# Patient Record
Sex: Female | Born: 1990 | Race: Black or African American | Hispanic: No | Marital: Single | Smoking: Current every day smoker
Health system: Southern US, Community
[De-identification: ages and names within clinical notes are randomized; demographics above are authoritative.]

## PROBLEM LIST (undated history)

## (undated) DIAGNOSIS — F419 Anxiety disorder, unspecified: Secondary | ICD-10-CM

## (undated) DIAGNOSIS — G43909 Migraine, unspecified, not intractable, without status migrainosus: Secondary | ICD-10-CM

---

## 2016-06-01 ENCOUNTER — Emergency Department (HOSPITAL_COMMUNITY): Payer: Medicaid Other

## 2016-06-01 ENCOUNTER — Encounter (HOSPITAL_COMMUNITY): Payer: Self-pay | Admitting: Emergency Medicine

## 2016-06-01 ENCOUNTER — Emergency Department (HOSPITAL_COMMUNITY)
Admission: EM | Admit: 2016-06-01 | Discharge: 2016-06-01 | Disposition: A | Discharge status: Home / Self Care | Payer: Medicaid Other | Attending: Emergency Medicine | Admitting: Emergency Medicine

## 2016-06-01 DIAGNOSIS — N760 Acute vaginitis: Secondary | ICD-10-CM | POA: Diagnosis not present

## 2016-06-01 DIAGNOSIS — Y999 Unspecified external cause status: Secondary | ICD-10-CM | POA: Insufficient documentation

## 2016-06-01 DIAGNOSIS — S6991XA Unspecified injury of right wrist, hand and finger(s), initial encounter: Secondary | ICD-10-CM | POA: Diagnosis present

## 2016-06-01 DIAGNOSIS — S60221A Contusion of right hand, initial encounter: Secondary | ICD-10-CM | POA: Insufficient documentation

## 2016-06-01 DIAGNOSIS — F17210 Nicotine dependence, cigarettes, uncomplicated: Secondary | ICD-10-CM | POA: Insufficient documentation

## 2016-06-01 DIAGNOSIS — Y9389 Activity, other specified: Secondary | ICD-10-CM | POA: Insufficient documentation

## 2016-06-01 DIAGNOSIS — Y9289 Other specified places as the place of occurrence of the external cause: Secondary | ICD-10-CM | POA: Insufficient documentation

## 2016-06-01 LAB — URINALYSIS, ROUTINE W REFLEX MICROSCOPIC
BILIRUBIN URINE: NEGATIVE
GLUCOSE, UA: NEGATIVE mg/dL
Hgb urine dipstick: NEGATIVE
KETONES UR: NEGATIVE mg/dL
NITRITE: NEGATIVE
PH: 6 (ref 5.0–8.0)
PROTEIN: NEGATIVE mg/dL
Specific Gravity, Urine: 1.025 (ref 1.005–1.030)

## 2016-06-01 LAB — URINE MICROSCOPIC-ADD ON

## 2016-06-01 LAB — WET PREP, GENITAL
Clue Cells Wet Prep HPF POC: NONE SEEN
SPERM: NONE SEEN
TRICH WET PREP: NONE SEEN
YEAST WET PREP: NONE SEEN

## 2016-06-01 LAB — POC URINE PREG, ED: Preg Test, Ur: NEGATIVE
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
## 2016-06-01 NOTE — ED Notes (Signed)
PT DISCHARGED. INSTRUCTIONS GIVEN. AAOX4. PT IN NO APPARENT DISTRESS OR PAIN. THE OPPORTUNITY TO ASK QUESTIONS WAS PROVIDED. 

## 2016-06-01 NOTE — ED Notes (Signed)
Bed: WA03 Expected date:  Expected time:  Means of arrival:  Comments: 
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
## 2016-06-02 LAB — GC/CHLAMYDIA PROBE AMP (~~LOC~~) NOT AT ARMC
Chlamydia: NEGATIVE
NEISSERIA GONORRHEA: NEGATIVE

## 2016-06-02 LAB — RPR: RPR: NONREACTIVE

## 2016-06-02 LAB — HIV ANTIBODY (ROUTINE TESTING W REFLEX): HIV Screen 4th Generation wRfx: NONREACTIVE

## 2016-10-17 ENCOUNTER — Emergency Department (HOSPITAL_COMMUNITY)
Admission: EM | Admit: 2016-10-17 | Discharge: 2016-10-17 | Disposition: A | Discharge status: Home / Self Care | Payer: Medicaid Other | Attending: Emergency Medicine | Admitting: Emergency Medicine

## 2016-10-17 ENCOUNTER — Encounter (HOSPITAL_COMMUNITY): Payer: Self-pay | Admitting: Emergency Medicine

## 2016-10-17 DIAGNOSIS — Z87891 Personal history of nicotine dependence: Secondary | ICD-10-CM | POA: Insufficient documentation

## 2016-10-17 DIAGNOSIS — G43809 Other migraine, not intractable, without status migrainosus: Secondary | ICD-10-CM | POA: Diagnosis not present

## 2016-10-17 DIAGNOSIS — G43909 Migraine, unspecified, not intractable, without status migrainosus: Secondary | ICD-10-CM | POA: Diagnosis present

## 2016-10-17 LAB — I-STAT BETA HCG BLOOD, ED (MC, WL, AP ONLY)

## 2016-10-17 MED ORDER — DEXAMETHASONE SODIUM PHOSPHATE 10 MG/ML IJ SOLN
10.0000 mg | Freq: Once | INTRAMUSCULAR | Status: AC
  Administered 2016-10-17: 10 mg via INTRAMUSCULAR
  Filled 2016-10-17: qty 1

## 2016-10-17 MED ORDER — DIPHENHYDRAMINE HCL 25 MG PO CAPS
50.0000 mg | ORAL_CAPSULE | ORAL | Status: AC
  Administered 2016-10-17: 50 mg via ORAL
  Filled 2016-10-17: qty 2

## 2016-10-17 MED ORDER — KETOROLAC TROMETHAMINE 60 MG/2ML IM SOLN
60.0000 mg | Freq: Once | INTRAMUSCULAR | Status: AC
  Administered 2016-10-17: 60 mg via INTRAMUSCULAR
  Filled 2016-10-17: qty 2

## 2016-10-17 NOTE — ED Provider Notes (Addendum)
[facility]-EMERGENCY DEPT Provider Note   CSN: [id number] Arrival date & time: 10/17/16  0121   By signing my name below, I, Clovis PuAvnee Patel, attest that this documentation has been prepared under the direction and in the presence of Huck Ashworth, MD  Electronically Signed: Clovis PuAvnee Patel, ED Scribe. 10/17/16. 3:46 AM.   History   Chief Complaint Chief Complaint  Patient presents with  . Migraine   The history is provided by the patient. No language interpreter was used.  Migraine  This is a new problem. The current episode started yesterday. The problem has been gradually worsening. Associated symptoms include headaches. Pertinent negatives include no chest pain, no abdominal pain and no shortness of breath. Nothing aggravates the symptoms. Nothing relieves the symptoms. Treatments tried: BC powder  The treatment provided mild relief.   HPI Comments:  Rebekah Bray is a 26 y.o. female who presents to the Emergency Department complaining of acute onset, gradually worsening, moderate posterior headache onset yesterday. She also reports photophobia. She has taken BC powder, last taken at 8 PM, without relief. Pt denies any other associated symptoms at this time. No other complaints noted.    History reviewed. No pertinent past medical history.  There are no active problems to display for this patient.   History reviewed. No pertinent surgical history.  OB History    No data available       Home Medications    Prior to Admission medications   Not on File    Family History No family history on file.  Social History Social History  Substance Use Topics  . Smoking status: Former Smoker    Types: Cigarettes  . Smokeless tobacco: Never Used  . Alcohol use Yes     Comment: socially     Allergies   Patient has no known allergies.   Review of Systems Review of Systems  Constitutional: Negative for diaphoresis and fever.  HENT: Negative for congestion.   Eyes: Positive for  photophobia. Negative for visual disturbance.  Respiratory: Negative for shortness of breath.   Cardiovascular: Negative for chest pain.  Gastrointestinal: Negative for abdominal pain and vomiting.  Musculoskeletal: Negative for neck pain and neck stiffness.  Skin: Negative for rash.  Neurological: Positive for headaches. Negative for dizziness, tremors, seizures, syncope, facial asymmetry, speech difficulty, weakness, light-headedness and numbness.  All other systems reviewed and are negative.  Physical Exam Updated Vital Signs BP 115/70 (BP Location: Left Arm)   Pulse 72   Temp 98 F (36.7 C) (Oral)   Resp 17   Ht 5\' 9"  (1.753 m)   Wt 197 lb (89.4 kg)   SpO2 94%   BMI 29.09 kg/m   Physical Exam  Constitutional: She is oriented to person, place, and time. She appears well-developed and well-nourished. No distress.  HENT:  Head: Normocephalic and atraumatic.  Mouth/Throat: Oropharynx is clear and moist. No oropharyngeal exudate.  Moist mucous membranes no proptosis no change in cognition  Eyes: Conjunctivae and EOM are normal. Pupils are equal, round, and reactive to light.  Neck: Normal range of motion. Neck supple. No JVD present. No thyromegaly present.  Trachea midline No bruit  Cardiovascular: Normal rate, regular rhythm and normal heart sounds.   Pulmonary/Chest: Effort normal and breath sounds normal. No stridor. No respiratory distress. She has no wheezes. She has no rales.  Abdominal: Soft. Bowel sounds are normal. She exhibits no distension and no mass. There is no tenderness. There is no rebound and no guarding.  Musculoskeletal: Normal  range of motion. She exhibits no edema.  Lymphadenopathy:    She has no cervical adenopathy.  Neurological: She is alert and oriented to person, place, and time. She has normal reflexes. She displays normal reflexes. No cranial nerve deficit. She exhibits normal muscle tone. Coordination normal.  CN 3-12 intact. 5/5 strength to upper  extremities.   Skin: Skin is warm and dry. Capillary refill takes less than 2 seconds.  Psychiatric: She has a normal mood and affect. Her behavior is normal.  Nursing note and vitals reviewed.    ED Treatments / Results   Vitals:   10/17/16 0129  BP: 115/70  Pulse: 72  Resp: 17  Temp: 98 F (36.7 C)    DIAGNOSTIC STUDIES: Oxygen Saturation is 94% on RA, normal by my interpretation.    Results for orders placed or performed during the hospital encounter of 10/17/16  I-Stat Beta hCG blood, ED ([facility], [facility], [facility] only)  Result Value Ref Range   I-stat hCG, quantitative <5.0 <5 mIU/mL   Comment 3           No results found.  COORDINATION OF CARE: 3:45 AM Discussed treatment plan with pt at bedside and pt agreed to plan.   Radiology No results found.  Procedures Procedures (including critical care time)  Medications Ordered in ED Medications  ketorolac (TORADOL) injection 60 mg (not administered)  dexamethasone (DECADRON) injection 10 mg (not administered)  diphenhydrAMINE (BENADRYL) capsule 50 mg (not administered)     Initial Impression / Assessment and Plan / ED Course  I have reviewed the triage vital signs and the nursing notes. Pertinent labs & imaging results that were available during my care of the patient were reviewed by me and considered in my medical decision making (see chart for details).     This is a 26 y.o. -year-old female presents with her typical migraine.  The patient is nontoxic-appearing on exam and vital signs are normal.  No signs of infection, or thrombosis.  No indication for CT or LP at this time.    The patient is nontoxic-appearing on exam and vital signs are normal. After history, exam, and medical workup I feel the patient has been appropriately medically screened and is safe for discharge home. Pertinent diagnoses were discussed with the patient. Patient was given return precautions.  Strict return precautions for fever, weakness,  changes in vision or speech or cognition, numbnessor any concerns. I personally performed the services described in this documentation, which was scribed in my presence. The recorded information has been reviewed and is accurate.   After history, exam, and medical workup I feel the patient has been appropriately medically screened and is safe for discharge home. Pertinent diagnoses were discussed with the patient. Patient was given return precautions.  I personally performed the services described in this documentation, which was scribed in my presence. The recorded information has been reviewed and is accurate.       Cy Blamer, MD 10/17/16 [facility], MD 10/17/16 1610    Cy Blamer, MD 10/17/16 [id number]

## 2016-10-17 NOTE — ED Triage Notes (Signed)
Pt comes with complaints of a headache that started earlier today.  About an hour or two ago it got more severe.  Sensitive to light.  Endorses emesis. Took goody powder earlier with very temporary relief.

## 2016-11-04 ENCOUNTER — Encounter (HOSPITAL_COMMUNITY): Payer: Self-pay | Admitting: Family Medicine

## 2016-11-04 ENCOUNTER — Ambulatory Visit (HOSPITAL_COMMUNITY)
Admission: EM | Admit: 2016-11-04 | Discharge: 2016-11-04 | Disposition: A | Discharge status: Home / Self Care | Payer: Medicaid Other | Attending: Family Medicine | Admitting: Family Medicine

## 2016-11-04 DIAGNOSIS — M5432 Sciatica, left side: Secondary | ICD-10-CM | POA: Diagnosis not present

## 2016-11-04 MED ORDER — PREDNISONE 10 MG (21) PO TBPK: ORAL_TABLET | ORAL | 0 refills | Status: DC

## 2016-11-04 MED ORDER — CYCLOBENZAPRINE HCL 10 MG PO TABS: 10.0000 mg | ORAL_TABLET | Freq: Two times a day (BID) | ORAL | 0 refills | Status: DC | PRN

## 2016-11-04 NOTE — ED Provider Notes (Signed)
CSN: [id number]     Arrival date & time 11/04/16  1824 History   None    Chief Complaint  Patient presents with  . Fall   (Consider location/radiation/quality/duration/timing/severity/associated sxs/prior Treatment) Patient c/o left back discomfort radiating down her left buttock and to her left knee.   The history is provided by the patient.  Fall  This is a new problem. The current episode started yesterday. The problem occurs constantly. The problem has not changed since onset.   History reviewed. No pertinent past medical history. History reviewed. No pertinent surgical history. History reviewed. No pertinent family history. Social History  Substance Use Topics  . Smoking status: Former Smoker    Types: Cigarettes  . Smokeless tobacco: Never Used  . Alcohol use Yes     Comment: socially   OB History    No data available     Review of Systems  Constitutional: Negative.   HENT: Negative.   Eyes: Negative.   Respiratory: Negative.   Cardiovascular: Negative.   Gastrointestinal: Negative.   Endocrine: Negative.   Genitourinary: Negative.   Musculoskeletal: Positive for arthralgias.  Allergic/Immunologic: Negative.   Neurological: Positive for numbness.  Hematological: Negative.   Psychiatric/Behavioral: Negative.     Allergies  Patient has no known allergies.  Home Medications   Prior to Admission medications   Medication Sig Start Date End Date Taking? Authorizing Provider  cyclobenzaprine (FLEXERIL) 10 MG tablet Take 1 tablet (10 mg total) by mouth 2 (two) times daily as needed for muscle spasms. 11/04/16   Deatra CanterWilliam J Dasja Brase, FNP  predniSONE (STERAPRED UNI-PAK 21 TAB) 10 MG (21) TBPK tablet Take 6-5-4-3-2-1 po qd 11/04/16   Deatra CanterWilliam J Hrishikesh Hoeg, FNP   Meds Ordered and Administered this Visit  Medications - No data to display  BP 115/75   Pulse 90   Temp 98.4 F (36.9 C)   Resp 18   LMP 10/29/2016   SpO2 100%  No data found.   Physical Exam   Constitutional: She appears well-developed and well-nourished.  HENT:  Head: Normocephalic and atraumatic.  Eyes: Conjunctivae and EOM are normal. Pupils are equal, round, and reactive to light.  Neck: Normal range of motion. Neck supple.  Cardiovascular: Normal rate, regular rhythm and normal heart sounds.   Pulmonary/Chest: Effort normal and breath sounds normal.  Musculoskeletal: Normal range of motion.  TTP left lumbar spine and left sciatic notch.  Neg SLR bilateral.  Nursing note and vitals reviewed.   Urgent Care Course     Procedures (including critical care time)  Labs Review Labs Reviewed - No data to display  Imaging Review No results found.   Visual Acuity Review  Right Eye Distance:   Left Eye Distance:   Bilateral Distance:    Right Eye Near:   Left Eye Near:    Bilateral Near:         MDM   1. Left sided sciatica    Prednisone taper 10mg  take 6-5-4-3-2-1 po qd  #21 Flexeril 10mg  one po bid prn #20  Note for work today  Explained to use pillow for cushion      Deatra CanterWilliam J Leonie Amacher, FNP 11/04/16 1907

## 2016-11-04 NOTE — ED Triage Notes (Addendum)
Pt here for fall at work on Saturday, sts she is hurting in her left lower back and radiating down left leg with numbness and tingling in her toes.

## 2016-11-25 ENCOUNTER — Emergency Department (HOSPITAL_COMMUNITY)
Admission: EM | Admit: 2016-11-25 | Discharge: 2016-11-25 | Disposition: A | Discharge status: Home / Self Care | Payer: Medicaid Other | Attending: Emergency Medicine | Admitting: Emergency Medicine

## 2016-11-25 ENCOUNTER — Encounter (HOSPITAL_COMMUNITY): Payer: Self-pay | Admitting: Emergency Medicine

## 2016-11-25 DIAGNOSIS — G43909 Migraine, unspecified, not intractable, without status migrainosus: Secondary | ICD-10-CM

## 2016-11-25 DIAGNOSIS — F17210 Nicotine dependence, cigarettes, uncomplicated: Secondary | ICD-10-CM | POA: Insufficient documentation

## 2016-11-25 DIAGNOSIS — Z79899 Other long term (current) drug therapy: Secondary | ICD-10-CM | POA: Insufficient documentation

## 2016-11-25 DIAGNOSIS — Z7982 Long term (current) use of aspirin: Secondary | ICD-10-CM | POA: Insufficient documentation

## 2016-11-25 HISTORY — DX: Migraine, unspecified, not intractable, without status migrainosus: G43.909

## 2016-11-25 MED ORDER — IBUPROFEN 800 MG PO TABS: 800.0000 mg | ORAL_TABLET | Freq: Three times a day (TID) | ORAL | 0 refills | Status: DC

## 2016-11-25 MED ORDER — METOCLOPRAMIDE HCL 10 MG PO TABS: 10.0000 mg | ORAL_TABLET | Freq: Four times a day (QID) | ORAL | 0 refills | Status: DC | PRN

## 2016-11-25 MED ORDER — DIPHENHYDRAMINE HCL 50 MG/ML IJ SOLN
25.0000 mg | Freq: Once | INTRAMUSCULAR | Status: AC
  Administered 2016-11-25: 25 mg via INTRAMUSCULAR
  Filled 2016-11-25: qty 1

## 2016-11-25 MED ORDER — METOCLOPRAMIDE HCL 5 MG/ML IJ SOLN
10.0000 mg | Freq: Once | INTRAMUSCULAR | Status: AC
  Administered 2016-11-25: 10 mg via INTRAMUSCULAR
  Filled 2016-11-25: qty 2

## 2016-11-25 MED ORDER — DIPHENHYDRAMINE HCL 25 MG PO CAPS: 25.0000 mg | ORAL_CAPSULE | Freq: Four times a day (QID) | ORAL | 0 refills | Status: DC | PRN

## 2016-11-25 MED ORDER — KETOROLAC TROMETHAMINE 60 MG/2ML IM SOLN
60.0000 mg | Freq: Once | INTRAMUSCULAR | Status: AC
  Administered 2016-11-25: 60 mg via INTRAMUSCULAR
  Filled 2016-11-25: qty 2

## 2016-11-25 NOTE — Discharge Instructions (Signed)
You may try Benadryl with Compazine and ibuprofen as prescribed every 6 hours if needed for migraine headache. See your family doctor for recheck within the next 2-3 days. See a neurologist if her headaches are changing in pattern or severity.

## 2016-11-25 NOTE — ED Triage Notes (Signed)
Pt is c/o migraine headache that started around midnight and woke her up out of her sleep  Pt states she has nausea and vomiting and is sensitive to light  Pt states it is mainly the left side of her head that hurts  Pt states she took a hydrocodone around 3am without relief

## 2016-11-25 NOTE — ED Notes (Signed)
When ask to provide a urine sample for POC, pt sts "Im on my periodTeacher, music informed pt she would relay that message to RN.  RN notified.

## 2016-11-25 NOTE — ED Provider Notes (Signed)
[facility]-EMERGENCY DEPT Provider Note   CSN: [id number] Arrival date & time: 11/25/16  4098     History   Chief Complaint Chief Complaint  Patient presents with  . Migraine    HPI Rebekah Bray is a 26 y.o. female.  HPI Patient poor she has a high headache that at midnight. She reports it woke her up. She reports she has sharp pain on the left side of her head. She reports nausea and vomiting in association with this. She reports photophobia. Patient poor she has had migraine headaches since teenage years. She reports this is similar to prior migraines. No other associated symptoms. Review of systems is negative. Patient tried hydrocodone at 3 AM without relief. Past Medical History:  Diagnosis Date  . Migraine headache     There are no active problems to display for this patient.   History reviewed. No pertinent surgical history.  OB History    No data available       Home Medications    Prior to Admission medications   Medication Sig Start Date End Date Taking? Authorizing Provider  aspirin-acetaminophen-caffeine (EXCEDRIN MIGRAINE) [phone] MG tablet Take 1-2 tablets by mouth every 6 (six) hours as needed for headache.   Yes Historical Provider, MD  Aspirin-Salicylamide-Caffeine (BC HEADACHE POWDER PO) Take 1 each by mouth daily as needed (migraine).   Yes Historical Provider, MD  cyclobenzaprine (FLEXERIL) 10 MG tablet Take 1 tablet (10 mg total) by mouth 2 (two) times daily as needed for muscle spasms. Patient not taking: Reported on 11/25/2016 11/04/16   Deatra Canter, FNP  diphenhydrAMINE (BENADRYL) 25 mg capsule Take 1 capsule (25 mg total) by mouth every 6 (six) hours as needed. 11/25/16   Arby Barrette, MD  ibuprofen (ADVIL,MOTRIN) 800 MG tablet Take 1 tablet (800 mg total) by mouth 3 (three) times daily. 11/25/16   Arby Barrette, MD  metoCLOPramide (REGLAN) 10 MG tablet Take 1 tablet (10 mg total) by mouth every 6 (six) hours as needed for nausea  (nausea/headache). 11/25/16   Arby Barrette, MD  predniSONE (STERAPRED UNI-PAK 21 TAB) 10 MG (21) TBPK tablet Take 6-5-4-3-2-1 po qd Patient not taking: Reported on 11/25/2016 11/04/16   Deatra Canter, FNP    Family History History reviewed. No pertinent family history.  Social History Social History  Substance Use Topics  . Smoking status: Current Every Day Smoker    Types: Cigarettes  . Smokeless tobacco: Never Used  . Alcohol use Yes     Comment: socially     Allergies   Patient has no known allergies.   Review of Systems Review of Systems 10 Systems reviewed and are negative for acute change except as noted in the HPI.   Physical Exam Updated Vital Signs BP 114/74 (BP Location: Left Arm)   Pulse 69   Temp 97.5 F (36.4 C) (Oral)   Resp 18   Ht  (1.753 m)   Wt 203 lb (92.1 kg)   LMP 11/23/2016 (Exact Date)   SpO2 98%   BMI 29.98 kg/m   Physical Exam  Constitutional: She is oriented to person, place, and time. She appears well-developed and well-nourished. No distress.  Patient lying in the room on her side. She is alert and appropriate. No respiratory distress.  HENT:  Head: Normocephalic and atraumatic.  Nose: Nose normal.  Mouth/Throat: Oropharynx is clear and moist.  Eyes: Conjunctivae and EOM are normal. Pupils are equal, round, and reactive to light.  Neck: Neck supple.  Cardiovascular:  Normal rate and regular rhythm.   No murmur heard. Pulmonary/Chest: Effort normal and breath sounds normal. No respiratory distress.  Abdominal: Soft. There is no tenderness.  Musculoskeletal: She exhibits no tenderness.  Neurological: She is alert and oriented to person, place, and time. No cranial nerve deficit. She exhibits normal muscle tone. Coordination normal.  Skin: Skin is warm and dry.  Psychiatric: She has a normal mood and affect.  Nursing note and vitals reviewed.    ED Treatments / Results  Labs (all labs ordered are listed, but only abnormal  results are displayed) Labs Reviewed  POC URINE PREG, ED    EKG  EKG Interpretation None       Radiology No results found.  Procedures Procedures (including critical care time)  Medications Ordered in ED Medications  ketorolac (TORADOL) injection 60 mg (60 mg Intramuscular Given 11/25/16 0758)  diphenhydrAMINE (BENADRYL) injection 25 mg (25 mg Intramuscular Given 11/25/16 0803)  metoCLOPramide (REGLAN) injection 10 mg (10 mg Intramuscular Given 11/25/16 0806)     Initial Impression / Assessment and Plan / ED Course  I have reviewed the triage vital signs and the nursing notes.  Pertinent labs & imaging results that were available during my care of the patient were reviewed by me and considered in my medical decision making (see chart for details).      Final Clinical Impressions(s) / ED Diagnoses   Final diagnoses:  Migraine without status migrainosus, not intractable, unspecified migraine type   Patient presents with typical migraine headache. No associated symptoms or infectious symptoms. Patient stable for discharge with headache instructions and Benadryl, ibuprofen and Reglan to take for headache as needed. Patient is advised to follow-up with her primary care provider. New Prescriptions New Prescriptions   DIPHENHYDRAMINE (BENADRYL) 25 MG CAPSULE    Take 1 capsule (25 mg total) by mouth every 6 (six) hours as needed.   IBUPROFEN (ADVIL,MOTRIN) 800 MG TABLET    Take 1 tablet (800 mg total) by mouth 3 (three) times daily.   METOCLOPRAMIDE (REGLAN) 10 MG TABLET    Take 1 tablet (10 mg total) by mouth every 6 (six) hours as needed for nausea (nausea/headache).     Arby Barrette, MD 11/25/16 [phone]

## 2017-09-16 ENCOUNTER — Encounter (HOSPITAL_COMMUNITY): Payer: Self-pay | Admitting: Emergency Medicine

## 2017-09-16 ENCOUNTER — Emergency Department (HOSPITAL_COMMUNITY)
Admission: EM | Admit: 2017-09-16 | Discharge: 2017-09-16 | Disposition: A | Discharge status: Home / Self Care | Payer: Self-pay | Attending: Emergency Medicine | Admitting: Emergency Medicine

## 2017-09-16 ENCOUNTER — Emergency Department (HOSPITAL_COMMUNITY): Payer: Self-pay

## 2017-09-16 DIAGNOSIS — J029 Acute pharyngitis, unspecified: Secondary | ICD-10-CM | POA: Insufficient documentation

## 2017-09-16 DIAGNOSIS — B9789 Other viral agents as the cause of diseases classified elsewhere: Secondary | ICD-10-CM | POA: Insufficient documentation

## 2017-09-16 DIAGNOSIS — J069 Acute upper respiratory infection, unspecified: Secondary | ICD-10-CM | POA: Insufficient documentation

## 2017-09-16 MED ORDER — ACETAMINOPHEN-CODEINE 120-12 MG/5ML PO SOLN: 10.0000 mL | ORAL | 0 refills | Status: AC | PRN

## 2017-09-16 MED ORDER — GUAIFENESIN ER 1200 MG PO TB12: 1.0000 | ORAL_TABLET | Freq: Two times a day (BID) | ORAL | 0 refills | Status: AC

## 2017-09-16 MED ORDER — IBUPROFEN 800 MG PO TABS: 800.0000 mg | ORAL_TABLET | Freq: Three times a day (TID) | ORAL | 0 refills | Status: AC | PRN

## 2017-09-16 NOTE — Discharge Instructions (Signed)
Return here as needed. Follow up with your doctor.INcrease your fluid intake. Rest as much as possible.

## 2017-09-16 NOTE — ED Notes (Signed)
Bed: WTR6 Expected date:  Expected time:  Means of arrival:  Comments: 

## 2017-09-16 NOTE — ED Triage Notes (Signed)
Per pt, states she has been coughing up "boogers girl" for the past week-states throat irritated, congestion-no relief with over the counter meds

## 2017-09-17 NOTE — ED Provider Notes (Signed)
[facility]-EMERGENCY DEPT Provider Note   CSN: [id number] Arrival date & time: 09/16/17  [id number]     History   Chief Complaint Chief Complaint  Patient presents with  . Cough  . Sore Throat    HPI Rebekah Bray is a 27 y.o. female.  HPI Patient presents to the emergency department with cough, nasal congestion, and sore throat.  The patient states this is been ongoing for the last week.  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  Patient states that nothing seems to make the condition better or worse.  The patient denies chest pain, shortness of breath, headache,blurred vision, neck pain, fever, weakness, numbness, dizziness, anorexia, edema, abdominal pain, nausea, vomiting, diarrhea, rash, back pain, dysuria, near syncope, or syncope. Past Medical History:  Diagnosis Date  . Migraine headache     There are no active problems to display for this patient.   History reviewed. No pertinent surgical history.  OB History    No data available       Home Medications    Prior to Admission medications   Medication Sig Start Date End Date Taking? Authorizing Provider  aspirin-acetaminophen-caffeine (EXCEDRIN MIGRAINE) [phone] MG tablet Take 2-3 tablets by mouth every 6 (six) hours as needed for headache.    Yes [provider]  Aspirin-Salicylamide-Caffeine (BC HEADACHE POWDER PO) Take 1 each by mouth daily as needed (migraine).   Yes [provider]  DM-Phenylephrine-Acetaminophen (THERAFLU EXPRESSMAX) 10-5-325 MG/15ML LIQD Take 30 mLs by mouth every 8 (eight) hours as needed (cold).   Yes [provider]  acetaminophen-codeine 120-12 MG/5ML solution Take 10 mLs by mouth every 4 (four) hours as needed for moderate pain (and cough). 09/16/17   Jennyfer Nickolson, Cristal Deerhristopher, PA-C  Guaifenesin 1200 MG TB12 Take 1 tablet (1,200 mg total) by mouth 2  (two) times daily. 09/16/17   Scotty Weigelt, Cristal Deerhristopher, PA-C  ibuprofen (ADVIL,MOTRIN) 800 MG tablet Take 1 tablet (800 mg total) by mouth every 8 (eight) hours as needed. 09/16/17   Charon Akamine, Cristal Deerhristopher, PA-C  metoCLOPramide (REGLAN) 10 MG tablet Take 1 tablet (10 mg total) by mouth every 6 (six) hours as needed for nausea (nausea/headache). Patient not taking: Reported on 09/16/2017 11/25/16   Arby BarrettePfeiffer, Marcy, MD    Family History No family history on file.  Social History Social History   Tobacco Use  . Smoking status: Current Every Day Smoker    Types: Cigarettes  . Smokeless tobacco: Never Used  Substance Use Topics  . Alcohol use: Yes    Comment: socially  . Drug use: No     Allergies   Patient has no known allergies.   Review of Systems Review of Systems All other systems negative except as documented in the HPI. All pertinent positives and negatives as reviewed in the HPI. Physical Exam Updated Vital Signs BP (!) 124/54 (BP Location: Left Arm)   Pulse 73   Temp 98.9 F (37.2 C) (Oral)   Resp 18   LMP 08/18/2017   SpO2 100%   Physical Exam  Constitutional: She is oriented to person, place, and time. She appears well-developed and well-nourished. No distress.  HENT:  Head: Normocephalic and atraumatic.  Mouth/Throat: Oropharynx is clear and moist.  Eyes: Pupils are equal, round, and reactive to light.  Neck: Normal range of motion. Neck supple.  Cardiovascular: Normal rate, regular rhythm and normal heart sounds. Exam reveals no gallop  and no friction rub.  No murmur heard. Pulmonary/Chest: Effort normal and breath sounds normal. No respiratory distress. She has no wheezes.  Abdominal: Soft. Bowel sounds are normal. She exhibits no distension. There is no tenderness.  Neurological: She is alert and oriented to person, place, and time. She exhibits normal muscle tone. Coordination normal.  Skin: Skin is warm and dry. Capillary refill takes less than 2 seconds. No rash  noted. No erythema.  Psychiatric: She has a normal mood and affect. Her behavior is normal.  Nursing note and vitals reviewed.    ED Treatments / Results  Labs (all labs ordered are listed, but only abnormal results are displayed) Labs Reviewed - No data to display  EKG  EKG Interpretation None       Radiology Dg Chest 2 View  Result Date: 09/16/2017 CLINICAL DATA:  Productive cough, shortness of breath, and nausea and vomiting for 1 week. EXAM: CHEST  2 VIEW COMPARISON:  None. FINDINGS: The heart size and mediastinal contours are within normal limits. Both lungs are clear. The visualized skeletal structures are unremarkable. IMPRESSION: Negative.  No active cardiopulmonary disease. Electronically Signed   By: Myles Rosenthal M.D.   On: 09/16/2017 12:43    Procedures Procedures (including critical care time)  Medications Ordered in ED Medications - No data to display   Initial Impression / Assessment and Plan / ED Course  I have reviewed the triage vital signs and the nursing notes.  Pertinent labs & imaging results that were available during my care of the patient were reviewed by me and considered in my medical decision making (see chart for details).     She will be treated for viral upper respiratory infection told return here as needed patient is advised to increase her fluid intake and rest as much as possible.  Patient agrees the plan and all questions were answered. Final Clinical Impressions(s) / ED Diagnoses   Final diagnoses:  Viral URI with cough    ED Discharge Orders        Ordered    ibuprofen (ADVIL,MOTRIN) 800 MG tablet  Every 8 hours PRN     09/16/17 1348    Guaifenesin 1200 MG TB12  2 times daily     09/16/17 1348    acetaminophen-codeine 120-12 MG/5ML solution  Every 4 hours PRN     09/16/17 1348       Charlestine Night, PA-C 09/17/17 1634    Gerhard Munch, MD 09/18/17 2203

## 2017-12-27 ENCOUNTER — Other Ambulatory Visit: Payer: Self-pay

## 2017-12-27 ENCOUNTER — Emergency Department (HOSPITAL_COMMUNITY)
Admission: EM | Admit: 2017-12-27 | Discharge: 2017-12-27 | Disposition: A | Discharge status: Home / Self Care | Payer: Medicaid Other | Attending: Emergency Medicine | Admitting: Emergency Medicine

## 2017-12-27 DIAGNOSIS — G43009 Migraine without aura, not intractable, without status migrainosus: Secondary | ICD-10-CM

## 2017-12-27 DIAGNOSIS — F17210 Nicotine dependence, cigarettes, uncomplicated: Secondary | ICD-10-CM | POA: Insufficient documentation

## 2017-12-27 MED ORDER — DIPHENHYDRAMINE HCL 25 MG PO CAPS
25.0000 mg | ORAL_CAPSULE | Freq: Once | ORAL | Status: AC
  Administered 2017-12-27: 25 mg via ORAL
  Filled 2017-12-27: qty 1

## 2017-12-27 MED ORDER — DEXAMETHASONE 4 MG PO TABS
10.0000 mg | ORAL_TABLET | Freq: Once | ORAL | Status: AC
  Administered 2017-12-27: 10 mg via ORAL
  Filled 2017-12-27: qty 2

## 2017-12-27 MED ORDER — METOCLOPRAMIDE HCL 10 MG PO TABS
10.0000 mg | ORAL_TABLET | Freq: Once | ORAL | Status: AC
  Administered 2017-12-27: 10 mg via ORAL
  Filled 2017-12-27: qty 1

## 2017-12-27 NOTE — ED Notes (Signed)
Bed: WA21 Expected date:  Expected time:  Means of arrival:  Comments: 

## 2017-12-27 NOTE — ED Provider Notes (Signed)
[facility]-EMERGENCY DEPT Provider Note  CSN: [id number] Arrival date & time: 12/27/17 1636  Chief Complaint(s) Migraine  HPI Rebekah Bray is a 27 y.o. female   The history is provided by the patient.  Migraine  This is a recurrent problem. The current episode started 6 to 12 hours ago. The problem occurs constantly. The problem has not changed since onset.Pertinent negatives include no chest pain, no abdominal pain and no shortness of breath. Exacerbated by: light and loud sounds. Nothing relieves the symptoms. Treatments tried: exedrine. The treatment provided mild relief.    Past Medical History Past Medical History:  Diagnosis Date  . Migraine headache    There are no active problems to display for this patient.  Home Medication(s) Prior to Admission medications   Medication Sig Start Date End Date Taking? Authorizing Provider  acetaminophen-codeine 120-12 MG/5ML solution Take 10 mLs by mouth every 4 (four) hours as needed for moderate pain (and cough). 09/16/17   Lawyer, Cristal Deer, PA-C  aspirin-acetaminophen-caffeine (EXCEDRIN MIGRAINE) [phone] MG tablet Take 2-3 tablets by mouth every 6 (six) hours as needed for headache.     [provider]  Aspirin-Salicylamide-Caffeine (BC HEADACHE POWDER PO) Take 1 each by mouth daily as needed (migraine).    [provider]  DM-Phenylephrine-Acetaminophen (THERAFLU EXPRESSMAX) 10-5-325 MG/15ML LIQD Take 30 mLs by mouth every 8 (eight) hours as needed (cold).    [provider]  Guaifenesin 1200 MG TB12 Take 1 tablet (1,200 mg total) by mouth 2 (two) times daily. 09/16/17   Lawyer, Cristal Deer, PA-C  ibuprofen (ADVIL,MOTRIN) 800 MG tablet Take 1 tablet (800 mg total) by mouth every 8 (eight) hours as needed. 09/16/17   Lawyer, Cristal Deer, PA-C  metoCLOPramide (REGLAN) 10 MG tablet Take 1 tablet (10 mg total) by mouth every 6 (six) hours as needed for nausea (nausea/headache). Patient not  taking: Reported on 09/16/2017 11/25/16   Arby Barrette, MD                                                                                                                                    Past Surgical History No past surgical history on file. Family History No family history on file.  Social History Social History   Tobacco Use  . Smoking status: Current Every Day Smoker    Types: Cigarettes  . Smokeless tobacco: Never Used  Substance Use Topics  . Alcohol use: Yes    Comment: socially  . Drug use: No   Allergies Patient has no known allergies.  Review of Systems Review of Systems  Respiratory: Negative for shortness of breath.   Cardiovascular: Negative for chest pain.  Gastrointestinal: Negative for abdominal pain.   All other systems are reviewed and are negative for acute change except as noted in the HPI  Physical Exam Vital Signs  I have reviewed the triage vital signs BP 102/76 (BP Location: Right Arm)   Pulse 64  Temp 97.8 F (36.6 C) (Oral)   Resp 18   Ht  (1.702 m)   Wt 99.8 kg (220 lb)   SpO2 100%   BMI 34.46 kg/m   Physical Exam  Constitutional: She is oriented to person, place, and time. She appears well-developed and well-nourished. No distress.  HENT:  Head: Normocephalic and atraumatic.  Right Ear: External ear normal.  Left Ear: External ear normal.  Nose: Nose normal.  Eyes: Conjunctivae and EOM are normal. No scleral icterus.  Neck: Normal range of motion and phonation normal.  Cardiovascular: Normal rate and regular rhythm.  Pulmonary/Chest: Effort normal. No stridor. No respiratory distress.  Abdominal: She exhibits no distension.  Musculoskeletal: Normal range of motion. She exhibits no edema.  Neurological: She is alert and oriented to person, place, and time.  Mental Status:  Alert and oriented to person, place, and time.  Attention and concentration normal.  Speech clear.  Recent memory is intact  Cranial Nerves:  II  Visual Fields: Intact to confrontation. Visual fields intact. III, IV, VI: Pupils equal and reactive to light and near. Full eye movement without nystagmus  V Facial Sensation: Normal. No weakness of masticatory muscles  VII: No facial weakness or asymmetry  VIII Auditory Acuity: Grossly normal  IX/X: The uvula is midline; the palate elevates symmetrically  XI: Normal sternocleidomastoid and trapezius strength  XII: The tongue is midline. No atrophy or fasciculations.   Motor System: Muscle Strength: 5/5 and symmetric in the upper and lower extremities. No pronation or drift.  Muscle Tone: Tone and muscle bulk are normal in the upper and lower extremities.   Reflexes: DTRs: 1+ and symmetrical in all four extremities. No Clonus Coordination:  No tremor.  Sensation: Intact to light touch, and pinprick. Gait: Routine gait normal.   Skin: She is not diaphoretic.  Psychiatric: She has a normal mood and affect. Her behavior is normal.  Vitals reviewed.   ED Results and Treatments Labs (all labs ordered are listed, but only abnormal results are displayed) Labs Reviewed - No data to display                                                                                                                       EKG  EKG Interpretation  Date/Time:    Ventricular Rate:    PR Interval:    QRS Duration:   QT Interval:    QTC Calculation:   R Axis:     Text Interpretation:        Radiology No results found. Pertinent labs & imaging results that were available during my care of the patient were reviewed by me and considered in my medical decision making (see chart for details).  Medications Ordered in ED Medications  metoCLOPramide (REGLAN) tablet 10 mg (has no administration in time range)  dexamethasone (DECADRON) tablet 10 mg (has no administration in time range)  diphenhydrAMINE (BENADRYL) capsule 25 mg (has no administration in time range)  Procedures Procedures  (including critical care time)  Medical Decision Making / ED Course I have reviewed the nursing notes for this encounter and the patient's prior records (if available in EHR or on provided paperwork).    Typical migraine headache for the pt. Non focal neuro exam. No recent head trauma. No fever. Doubt meningitis. Doubt intracranial bleed. Doubt IIH. No indication for imaging. Patient given option to treat with migraine cocktail orally with discharge or IV and reassessment. She opted oral medication, DC home, and rest in dark room.  The patient appears reasonably screened and/or stabilized for discharge and I doubt any other medical condition or other [facility] requiring further screening, evaluation, or treatment in the ED at this time prior to discharge.  The patient is safe for discharge with strict return precautions.   Final Clinical Impression(s) / ED Diagnoses Final diagnoses:  Migraine without aura and without status migrainosus, not intractable    Disposition: Discharge  Condition: Good  I have discussed the results, Dx and Tx plan with the patient who expressed understanding and agree(s) with the plan. Discharge instructions discussed at great length. The patient was given strict return precautions who verbalized understanding of the instructions. No further questions at time of discharge.    ED Discharge Orders    None       Follow Up: Primary care provider   If you do not have a primary care physician, contact [facility] at [phone] for referral  [facility] [address]     [address] [place] [address] [phone]    [facility] [address] [place], [address] [place] [address] [phone]       This chart was dictated using voice recognition software.  Despite best efforts  to proofread,  errors can occur which can change the documentation meaning.   Nira Conn, MD 12/27/17 1800

## 2017-12-27 NOTE — ED Triage Notes (Addendum)
Pt to ed via POV with complaints of a migraine that started this morning. Pt states she took Excedrin 3-4 hours ago with no relief. Pt does have history of migraines. Pt rates the pain as 8/10 posterior, on top of the head and behind her eyes. Pt denies an injury to her head, states that she has light sensitivity at this time, and admits to drinking ETOH last night.

## 2018-09-18 ENCOUNTER — Emergency Department (HOSPITAL_COMMUNITY)
Admission: EM | Admit: 2018-09-18 | Discharge: 2018-09-18 | Disposition: A | Discharge status: Home / Self Care | Payer: Medicaid Other | Attending: Emergency Medicine | Admitting: Emergency Medicine

## 2018-09-18 ENCOUNTER — Encounter (HOSPITAL_COMMUNITY): Payer: Self-pay | Admitting: *Deleted

## 2018-09-18 ENCOUNTER — Emergency Department (HOSPITAL_COMMUNITY): Payer: Medicaid Other

## 2018-09-18 DIAGNOSIS — Z720 Tobacco use: Secondary | ICD-10-CM

## 2018-09-18 DIAGNOSIS — R6889 Other general symptoms and signs: Secondary | ICD-10-CM

## 2018-09-18 DIAGNOSIS — R110 Nausea: Secondary | ICD-10-CM

## 2018-09-18 DIAGNOSIS — R05 Cough: Secondary | ICD-10-CM

## 2018-09-18 DIAGNOSIS — R059 Cough, unspecified: Secondary | ICD-10-CM

## 2018-09-18 DIAGNOSIS — J111 Influenza due to unidentified influenza virus with other respiratory manifestations: Secondary | ICD-10-CM | POA: Insufficient documentation

## 2018-09-18 DIAGNOSIS — F17210 Nicotine dependence, cigarettes, uncomplicated: Secondary | ICD-10-CM | POA: Insufficient documentation

## 2018-09-18 MED ORDER — KETOROLAC TROMETHAMINE 30 MG/ML IJ SOLN
30.0000 mg | Freq: Once | INTRAMUSCULAR | Status: AC
Start: 2018-09-18 — End: 2018-09-18
  Administered 2018-09-18: 30 mg via INTRAMUSCULAR
  Filled 2018-09-18: qty 1

## 2018-09-18 MED ORDER — ONDANSETRON 8 MG PO TBDP
8.0000 mg | ORAL_TABLET | Freq: Once | ORAL | Status: AC
  Administered 2018-09-18: 8 mg via ORAL
  Filled 2018-09-18: qty 1

## 2018-09-18 MED ORDER — ONDANSETRON 4 MG PO TBDP: 4.0000 mg | ORAL_TABLET | Freq: Three times a day (TID) | ORAL | 0 refills | Status: DC | PRN

## 2018-09-18 NOTE — Discharge Instructions (Signed)
Continue to stay well-hydrated. Gargle warm salt water and spit it out and use chloraseptic spray as needed for sore throat. Continue to alternate between Tylenol and Ibuprofen for pain or fever. Use Mucinex/Robitussin/etc for cough suppression/expectoration of mucus. Use over the counter flonase and the netipot to help with nasal congestion. May consider over-the-counter Benadryl or other antihistamine like Claritin/Zyrtec/etc to decrease secretions and for help with your symptoms. Use zofran as directed as needed for nausea. STOP SMOKING! Follow up with your primary care doctor in 5-7 days for recheck of ongoing symptoms. Return to emergency department for emergent changing or worsening of symptoms.

## 2018-09-18 NOTE — ED Triage Notes (Signed)
Pt complains of sore throat, productive cough for the past week. Pt states she developed headache today.

## 2018-09-18 NOTE — ED Provider Notes (Signed)
[facility]-EMERGENCY DEPT Provider Note   CSN: [id number] Arrival date & time: 09/18/18  1711     History   Chief Complaint Chief Complaint  Patient presents with  . Sore Throat  . Cough  . Headache    HPI Rebekah Bray is a 28 y.o. female with a PMHx of migraines, who presents to the ED with complaints of flulike symptoms that began 1 week ago.  Symptoms include moderate body aches, headaches, chills, cough with green sputum production, sore throat, nausea, and rhinorrhea.  She tried Mucinex and Tylenol about an hour prior to arrival but has not noticed any benefit.  No known aggravating factors.  She wants to see if she has the flu.  She did not receive her flu shot this year.  No known sick contacts.  She endorses being a cigarette smoker but states that she has now quit because of her symptoms.  She states her headache feels like prior headaches before.  She denies any vision changes, fevers, drooling, trismus, ear pain or drainage, chest pain, shortness of breath, abdominal pain, vomiting, diarrhea, constipation, dysuria, hematuria, focal arthralgias, numbness, tingling, focal weakness, or any other complaints at this time.  The history is provided by the patient and medical records. No language interpreter was used.  Sore Throat  Associated symptoms include headaches. Pertinent negatives include no chest pain, no abdominal pain and no shortness of breath.  Cough  Associated symptoms: chills, headaches, myalgias, rhinorrhea and sore throat   Associated symptoms: no chest pain, no ear pain, no fever and no shortness of breath   Headache  Associated symptoms: cough, myalgias, nausea and sore throat   Associated symptoms: no abdominal pain, no diarrhea, no ear pain, no fever, no numbness, no vomiting and no weakness     Past Medical History:  Diagnosis Date  . Migraine headache     There are no active problems to display for this patient.   History  reviewed. No pertinent surgical history.   OB History   No obstetric history on file.      Home Medications    Prior to Admission medications   Medication Sig Start Date End Date Taking? Authorizing Provider  acetaminophen-codeine 120-12 MG/5ML solution Take 10 mLs by mouth every 4 (four) hours as needed for moderate pain (and cough). 09/16/17   Lawyer, Cristal Deerhristopher, PA-C  aspirin-acetaminophen-caffeine (EXCEDRIN MIGRAINE) [phone] MG tablet Take 2-3 tablets by mouth every 6 (six) hours as needed for headache.     [provider]  Aspirin-Salicylamide-Caffeine (BC HEADACHE POWDER PO) Take 1 each by mouth daily as needed (migraine).    [provider]  DM-Phenylephrine-Acetaminophen (THERAFLU EXPRESSMAX) 10-5-325 MG/15ML LIQD Take 30 mLs by mouth every 8 (eight) hours as needed (cold).    [provider]  Guaifenesin 1200 MG TB12 Take 1 tablet (1,200 mg total) by mouth 2 (two) times daily. 09/16/17   Lawyer, Cristal Deerhristopher, PA-C  ibuprofen (ADVIL,MOTRIN) 800 MG tablet Take 1 tablet (800 mg total) by mouth every 8 (eight) hours as needed. 09/16/17   Lawyer, Cristal Deerhristopher, PA-C  metoCLOPramide (REGLAN) 10 MG tablet Take 1 tablet (10 mg total) by mouth every 6 (six) hours as needed for nausea (nausea/headache). Patient not taking: Reported on 09/16/2017 11/25/16   Arby BarrettePfeiffer, Marcy, MD    Family History No family history on file.  Social History Social History   Tobacco Use  . Smoking status: Current Every Day Smoker    Types: Cigarettes  . Smokeless tobacco: Never  Used  Substance Use Topics  . Alcohol use: Yes    Comment: socially  . Drug use: No     Allergies   Patient has no known allergies.   Review of Systems Review of Systems  Constitutional: Positive for chills. Negative for fever.  HENT: Positive for rhinorrhea and sore throat. Negative for drooling, ear discharge, ear pain and trouble swallowing.   Eyes: Negative for visual disturbance.    Respiratory: Positive for cough. Negative for shortness of breath.   Cardiovascular: Negative for chest pain.  Gastrointestinal: Positive for nausea. Negative for abdominal pain, constipation, diarrhea and vomiting.  Genitourinary: Negative for dysuria and hematuria.  Musculoskeletal: Positive for myalgias. Negative for arthralgias.  Skin: Negative for color change.  Allergic/Immunologic: Negative for immunocompromised state.  Neurological: Positive for headaches. Negative for weakness and numbness.  Psychiatric/Behavioral: Negative for confusion.   All other systems reviewed and are negative for acute change except as noted in the HPI.    Physical Exam Updated Vital Signs BP 124/77 (BP Location: Left Arm)   Pulse 78   Temp 97.9 F (36.6 C) (Oral)   Resp 18   LMP 09/11/2018   SpO2 100%   Physical Exam Vitals signs and nursing note reviewed.  Constitutional:      General: She is not in acute distress.    Appearance: Normal appearance. She is well-developed. She is not toxic-appearing.     Comments: Afebrile, nontoxic, NAD  HENT:     Head: Normocephalic and atraumatic.     Right Ear: Hearing, tympanic membrane, ear canal and external ear normal.     Left Ear: Hearing, tympanic membrane, ear canal and external ear normal.     Ears:     Comments: Ears clear bilaterally    Nose: Rhinorrhea present.     Comments: Mild rhinorrhea    Mouth/Throat:     Mouth: Mucous membranes are moist.     Pharynx: Oropharynx is clear. Uvula midline. No pharyngeal swelling, oropharyngeal exudate, posterior oropharyngeal erythema or uvula swelling.     Tonsils: No tonsillar exudate or tonsillar abscesses.     Comments: Oropharynx clear and moist, without uvular swelling or deviation, no trismus or drooling, no tonsillar swelling or erythema, no exudates.   Eyes:     General:        Right eye: No discharge.        Left eye: No discharge.     Conjunctiva/sclera: Conjunctivae normal.  Neck:      Musculoskeletal: Normal range of motion and neck supple.  Cardiovascular:     Rate and Rhythm: Normal rate and regular rhythm.     Pulses: Normal pulses.     Heart sounds: Normal heart sounds, S1 normal and S2 normal. No murmur. No friction rub. No gallop.   Pulmonary:     Effort: Pulmonary effort is normal. No respiratory distress.     Breath sounds: Normal breath sounds. No decreased breath sounds, wheezing, rhonchi or rales.     Comments: CTAB in all lung fields, no w/r/r, no hypoxia or increased WOB, speaking in full sentences, SpO2 100% on RA  Abdominal:     General: Bowel sounds are normal. There is no distension.     Palpations: Abdomen is soft. Abdomen is not rigid.     Tenderness: There is no abdominal tenderness. There is no right CVA tenderness, left CVA tenderness, guarding or rebound. Negative signs include Murphy's sign and McBurney's sign.     Comments: Soft, NTND, +BS  throughout, no r/g/r, neg murphy's, neg mcburney's, no CVA TTP   Musculoskeletal: Normal range of motion.  Skin:    General: Skin is warm and dry.     Findings: No rash.  Neurological:     Mental Status: She is alert and oriented to person, place, and time.     Sensory: Sensation is intact. No sensory deficit.     Motor: Motor function is intact.     Comments: No focal neuro deficits  Psychiatric:        Mood and Affect: Mood and affect normal.        Behavior: Behavior normal.      ED Treatments / Results  Labs (all labs ordered are listed, but only abnormal results are displayed) Labs Reviewed - No data to display  EKG None  Radiology Dg Chest 2 View  Result Date: 09/18/2018 CLINICAL DATA:  28 year old female with sore throat, productive cough, headache. Smoker. EXAM: CHEST - 2 VIEW COMPARISON:  09/16/2017. FINDINGS: Lung volumes and mediastinal contours are stable and within normal limits. Visualized tracheal air column is within normal limits. No pneumothorax or pleural effusion. No confluent  pulmonary opacity. Borderline to mild increased interstitial markings appear stable. No acute osseous abnormality identified. Negative visible bowel gas pattern. Incidental no pole piercings. IMPRESSION: Mild increased interstitial markings appear stable and are probably smoking related. No acute cardiopulmonary abnormality. Electronically Signed   By: Odessa Fleming M.D.   On: 09/18/2018 18:20    Procedures Procedures (including critical care time)  Medications Ordered in ED Medications  ondansetron (ZOFRAN-ODT) disintegrating tablet 8 mg (8 mg Oral Given 09/18/18 1833)  ketorolac (TORADOL) 30 MG/ML injection 30 mg (30 mg Intramuscular Given 09/18/18 1833)     Initial Impression / Assessment and Plan / ED Course  I have reviewed the triage vital signs and the nursing notes.  Pertinent labs & imaging results that were available during my care of the patient were reviewed by me and considered in my medical decision making (see chart for details).     28 y.o. female here with flulike symptoms that began 1 week ago.  On exam, clear lungs, no abdominal tenderness, no focal neuro deficits, overall well-appearing, vital signs stable.  Throat clear.  Ears clear.  Nose with mild rhinorrhea. Suspect flu or other viral URI.  Doubt need for testing for flu at this time since she is outside of the window of time with Tamiflu would be effective.  Doubt strep.  Will obtain chest x-ray to evaluate for pneumonia.  Will give Toradol, Zofran, and reassess shortly.  6:38 PM CXR with stable mild increased interstitial markings likely from smoking hx, no acute findings. Pt feeling better. Likely viral URI/flu, outside of tamiflu window. Will rx zofran, advised OTC remedies for symptomatic relief, f/up with PCP in 1wk for recheck, smoking cessation strongly encouraged. I explained the diagnosis and have given explicit precautions to return to the ER including for any other new or worsening symptoms. The patient understands  and accepts the medical plan as it's been dictated and I have answered their questions. Discharge instructions concerning home care and prescriptions have been given. The patient is STABLE and is discharged to home in good condition.    Final Clinical Impressions(s) / ED Diagnoses   Final diagnoses:  Flu-like symptoms  Cough  Tobacco user  Nausea    ED Discharge Orders         Ordered    ondansetron (ZOFRAN ODT) 4 MG disintegrating  tablet  Every 8 hours PRN     09/18/18 [address]1827           Delbert Darley, BryantownMercedes, [place] JerseyPA-C 09/18/18 1838    Tegeler, Canary Brimhristopher J, MD 09/18/18 [phone]

## 2019-07-04 ENCOUNTER — Encounter (HOSPITAL_COMMUNITY): Payer: Self-pay

## 2019-07-04 ENCOUNTER — Other Ambulatory Visit: Payer: Self-pay

## 2019-07-04 ENCOUNTER — Ambulatory Visit (HOSPITAL_COMMUNITY)
Admission: EM | Admit: 2019-07-04 | Discharge: 2019-07-04 | Disposition: A | Discharge status: Home / Self Care | Payer: Medicaid Other | Attending: Internal Medicine | Admitting: Internal Medicine

## 2019-07-04 DIAGNOSIS — Z20828 Contact with and (suspected) exposure to other viral communicable diseases: Secondary | ICD-10-CM | POA: Insufficient documentation

## 2019-07-04 DIAGNOSIS — J029 Acute pharyngitis, unspecified: Secondary | ICD-10-CM | POA: Insufficient documentation

## 2019-07-04 DIAGNOSIS — F17210 Nicotine dependence, cigarettes, uncomplicated: Secondary | ICD-10-CM

## 2019-07-04 DIAGNOSIS — Z20822 Contact with and (suspected) exposure to covid-19: Secondary | ICD-10-CM

## 2019-07-04 LAB — POCT RAPID STREP A: Streptococcus, Group A Screen (Direct): NEGATIVE

## 2019-07-04 MED ORDER — FLUTICASONE PROPIONATE 50 MCG/ACT NA SUSP: 1.0000 | Freq: Every day | NASAL | 0 refills | Status: AC

## 2019-07-04 NOTE — ED Triage Notes (Signed)
Pt presents with sore throat and headache since Friday.

## 2019-07-04 NOTE — Discharge Instructions (Signed)
Strep test was negative/ collection sent for culture Will call if result is positive Advised to gargle with Salty warm water Return to clinic if symptom get worse  COVID testing ordered.  It will take between 2-7 days for test results.  Someone will contact you regarding abnormal results.    In the meantime: You should remain isolated in your home for 10 days from symptom onset AND greater than 72 hours after symptoms resolution (absence of fever without the use of fever-reducing medication and improvement in respiratory symptoms), whichever is longer Get plenty of rest and push fluids Flonase prescribed for nasal congestion and runny nose Use medications daily for symptom relief Use OTC medications like ibuprofen or tylenol as needed fever or pain Call or go to the ED if you have any new or worsening symptoms such as fever, worsening cough, shortness of breath, chest tightness, chest pain, turning blue, changes in mental status, etc..Marland Kitchen

## 2019-07-04 NOTE — ED Provider Notes (Signed)
[facility]    CSN: [id number] Arrival date & time: 07/04/19  1405      History   Chief Complaint Chief Complaint  Patient presents with  . Sore Throat  . Headache    HPI Rebekah Bray is a 28 y.o. female.   The history is provided by the patient. No language interpreter was used.  Sore Throat This is a new problem. The current episode started more than 2 days ago. The problem occurs constantly. The problem has been gradually worsening. Associated symptoms include headaches. Pertinent negatives include no chest pain, no abdominal pain and no shortness of breath. Nothing aggravates the symptoms. Nothing relieves the symptoms. She has tried acetaminophen for the symptoms.  Headache Associated symptoms: cough, ear pain and sore throat   Associated symptoms: no abdominal pain, no diarrhea, no fatigue, no fever, no nausea and no vomiting     Past Medical History:  Diagnosis Date  . Migraine headache     There are no active problems to display for this patient.   History reviewed. No pertinent surgical history.  OB History   No obstetric history on file.      Home Medications    Prior to Admission medications   Medication Sig Start Date End Date Taking? Authorizing Provider  acetaminophen-codeine 120-12 MG/5ML solution Take 10 mLs by mouth every 4 (four) hours as needed for moderate pain (and cough). 09/16/17   Lawyer, Harrell Gave, PA-C  aspirin-acetaminophen-caffeine (EXCEDRIN MIGRAINE) [phone] MG tablet Take 2-3 tablets by mouth every 6 (six) hours as needed for headache.     [provider]  Aspirin-Salicylamide-Caffeine (BC HEADACHE POWDER PO) Take 1 each by mouth daily as needed (migraine).    [provider]  DM-Phenylephrine-Acetaminophen (THERAFLU EXPRESSMAX) 10-5-325 MG/15ML LIQD Take 30 mLs by mouth every 8 (eight) hours as needed (cold).    [provider]  fluticasone (FLONASE) 50 MCG/ACT nasal spray Place 1 spray  into both nostrils daily for 14 days. 07/04/19 07/18/19  Samariah Hokenson, Darrelyn Hillock, FNP  Guaifenesin 1200 MG TB12 Take 1 tablet (1,200 mg total) by mouth 2 (two) times daily. 09/16/17   Lawyer, Harrell Gave, PA-C  ibuprofen (ADVIL,MOTRIN) 800 MG tablet Take 1 tablet (800 mg total) by mouth every 8 (eight) hours as needed. 09/16/17   Lawyer, Harrell Gave, PA-C  metoCLOPramide (REGLAN) 10 MG tablet Take 1 tablet (10 mg total) by mouth every 6 (six) hours as needed for nausea (nausea/headache). Patient not taking: Reported on 09/16/2017 11/25/16   Charlesetta Shanks, MD  ondansetron (ZOFRAN ODT) 4 MG disintegrating tablet Take 1 tablet (4 mg total) by mouth every 8 (eight) hours as needed for nausea or vomiting. 09/18/18   Street, Rutledge, PA-C    Family History Family History  Problem Relation Age of Onset  . Healthy Neg Hx     Social History Social History   Tobacco Use  . Smoking status: Current Every Day Smoker    Types: Cigarettes  . Smokeless tobacco: Never Used  Substance Use Topics  . Alcohol use: Yes    Comment: socially  . Drug use: No     Allergies   Patient has no known allergies.   Review of Systems Review of Systems  Constitutional: Negative for activity change, appetite change, chills, fatigue and fever.  HENT: Positive for ear pain and sore throat.   Respiratory: Positive for cough. Negative for chest tightness and shortness of breath.   Cardiovascular: Negative for chest pain and leg swelling.  Gastrointestinal: Negative for  abdominal pain, diarrhea, nausea and vomiting.  Neurological: Positive for headaches.  ROS: all other are negatives  Physical Exam Triage Vital Signs ED Triage Vitals [07/04/19 1455]  Enc Vitals Group     BP 130/86     Pulse Rate 78     Resp 17     Temp 98.3 F (36.8 C)     Temp Source Oral     SpO2 100 %     Weight      Height      Head Circumference      Peak Flow      Pain Score      Pain Loc      Pain Edu?      Excl. in GC?    No  data found.  Updated Vital Signs BP 130/86 (BP Location: Right Arm)   Pulse 78   Temp 98.3 F (36.8 C) (Oral)   Resp 17   LMP 06/03/2019   SpO2 100%   Visual Acuity Right Eye Distance:   Left Eye Distance:   Bilateral Distance:    Right Eye Near:   Left Eye Near:    Bilateral Near:     Physical Exam Vitals signs and nursing note reviewed.  Constitutional:      General: She is not in acute distress.    Appearance: She is well-developed and normal weight. She is not ill-appearing or toxic-appearing.  HENT:     Head: Normocephalic.     Right Ear: Tympanic membrane and ear canal normal.     Left Ear: Tympanic membrane and ear canal normal.     Nose: No congestion.     Mouth/Throat:     Mouth: Mucous membranes are moist. No oral lesions.     Pharynx: No pharyngeal swelling, oropharyngeal exudate, posterior oropharyngeal erythema or uvula swelling.     Tonsils: No tonsillar exudate or tonsillar abscesses. 1+ on the right. 1+ on the left.  Cardiovascular:     Rate and Rhythm: Normal rate and regular rhythm.     Heart sounds: Normal heart sounds. No murmur. No friction rub. No gallop.   Pulmonary:     Effort: Pulmonary effort is normal. No respiratory distress.     Breath sounds: Rhonchi present. No wheezing.  Chest:     Chest wall: No tenderness.  Abdominal:     General: Bowel sounds are normal.     Palpations: Abdomen is soft.  Neurological:     Mental Status: She is alert and oriented to person, place, and time.      UC Treatments / Results  Labs (all labs ordered are listed, but only abnormal results are displayed) Labs Reviewed  NOVEL CORONAVIRUS, NAA (HOSP ORDER, SEND-OUT TO REF LAB; TAT 18-24 HRS)  CULTURE, GROUP A STREP [facility])  POCT RAPID STREP A    EKG   Radiology No results found.  Procedures Procedures (including critical care time)  Medications Ordered in UC Medications - No data to display  Initial Impression / Assessment and Plan / UC  Course  I have reviewed the triage vital signs and the nursing notes.  Pertinent labs & imaging results that were available during my care of the patient were reviewed by me and considered in my medical decision making (see chart for details).   Strep test was negative and collection was sent for culture. COVID test was ordered and patient was discharge in stable condition with no respiratory distress. Final Clinical Impressions(s) / UC Diagnoses  Final diagnoses:  Viral pharyngitis  Suspected COVID-19 virus infection     Discharge Instructions     Strep test was negative/ collection sent for culture Will call if result is positive Advised to gargle with Salty warm water Return to clinic if symptom get worse  COVID testing ordered.  It will take between 2-7 days for test results.  Someone will contact you regarding abnormal results.    In the meantime: You should remain isolated in your home for 10 days from symptom onset AND greater than 72 hours after symptoms resolution (absence of fever without the use of fever-reducing medication and improvement in respiratory symptoms), whichever is longer Get plenty of rest and push fluids Flonase prescribed for nasal congestion and runny nose Use medications daily for symptom relief Use OTC medications like ibuprofen or tylenol as needed fever or pain Call or go to the ED if you have any new or worsening symptoms such as fever, worsening cough, shortness of breath, chest tightness, chest pain, turning blue, changes in mental status, etc...    ED Prescriptions    Medication Sig Dispense Auth. Provider   fluticasone (FLONASE) 50 MCG/ACT nasal spray Place 1 spray into both nostrils daily for 14 days. 16 g Durward Parcel, FNP     PDMP not reviewed this encounter.   Durward Parcel, FNP 07/04/19 1531

## 2019-07-06 LAB — NOVEL CORONAVIRUS, NAA (HOSP ORDER, SEND-OUT TO REF LAB; TAT 18-24 HRS): SARS-CoV-2, NAA: NOT DETECTED

## 2019-07-07 LAB — CULTURE, GROUP A STREP (THRC)

## 2019-07-08 ENCOUNTER — Telehealth: Payer: Self-pay | Admitting: Emergency Medicine

## 2019-07-08 NOTE — Telephone Encounter (Signed)
Pt called requesting covid test results. Results reported as negative.  

## 2019-11-18 ENCOUNTER — Emergency Department (HOSPITAL_COMMUNITY)
Admission: EM | Admit: 2019-11-18 | Discharge: 2019-11-18 | Disposition: A | Discharge status: Home / Self Care | Payer: Medicaid Other | Attending: Emergency Medicine | Admitting: Emergency Medicine

## 2019-11-18 ENCOUNTER — Encounter (HOSPITAL_COMMUNITY): Payer: Self-pay

## 2019-11-18 ENCOUNTER — Other Ambulatory Visit: Payer: Self-pay

## 2019-11-18 DIAGNOSIS — F17210 Nicotine dependence, cigarettes, uncomplicated: Secondary | ICD-10-CM | POA: Insufficient documentation

## 2019-11-18 DIAGNOSIS — F419 Anxiety disorder, unspecified: Secondary | ICD-10-CM

## 2019-11-18 HISTORY — DX: Anxiety disorder, unspecified: F41.9

## 2019-11-18 MED ORDER — HYDROXYZINE HCL 25 MG PO TABS: 25.0000 mg | ORAL_TABLET | Freq: Four times a day (QID) | ORAL | 0 refills | Status: AC | PRN

## 2019-11-18 NOTE — ED Triage Notes (Signed)
Patient c/o anxiety and insomnia. Patient states she has been off of her psych meds x 6 months . Patient states she thought she could do without, but recently had a family event and felt like she could not handle everything that was going on. Patient denies SI/Hi.

## 2019-11-18 NOTE — ED Provider Notes (Signed)
[facility]-EMERGENCY DEPT Provider Note   CSN: [id number] Arrival date & time: 11/18/19  6160     History Chief Complaint  Patient presents with  . Anxiety    Rebekah Bray is a 29 y.o. female.  The history is provided by the patient and medical records. No language interpreter was used.  Anxiety     29 year old female with history of anxiety presenting here with complaints of feeling anxious.  Patient reports she was previously on Klonopin, Zoloft, and sertraline for her anxiety and depression.  She went off of her medication approximately 6 months ago when she felt she is well enough not needing medication and that her medication ran out.  For the past several weeks she endorsed worsening anxiety.  She treated her anxiety to increased stress from daily life but she denies any SI or HI.  She denies self-medicating with alcohol or drugs.  She is having more trouble getting adequate sleep.  She having bouts of anxiety and panic attack.  She does not have any significant pain.  She has not been seen by her psychiatrist for more than 6 months due to lack of resources.  She is here requesting help.  Past Medical History:  Diagnosis Date  . Anxiety   . Migraine headache     There are no problems to display for this patient.   History reviewed. No pertinent surgical history.   OB History   No obstetric history on file.     Family History  Problem Relation Age of Onset  . Hypertension Mother   . Diabetes Mother   . Healthy Neg Hx     Social History   Tobacco Use  . Smoking status: Current Every Day Smoker    Packs/day: 0.35    Types: Cigarettes  . Smokeless tobacco: Never Used  Substance Use Topics  . Alcohol use: Yes    Comment: socially  . Drug use: No    Home Medications Prior to Admission medications   Medication Sig Start Date End Date Taking? Authorizing Provider  acetaminophen-codeine 120-12 MG/5ML solution Take 10 mLs by mouth every  4 (four) hours as needed for moderate pain (and cough). 09/16/17   Lawyer, Cristal Deer, PA-C  aspirin-acetaminophen-caffeine (EXCEDRIN MIGRAINE) [phone] MG tablet Take 2-3 tablets by mouth every 6 (six) hours as needed for headache.     [provider]  Aspirin-Salicylamide-Caffeine (BC HEADACHE POWDER PO) Take 1 each by mouth daily as needed (migraine).    [provider]  DM-Phenylephrine-Acetaminophen (THERAFLU EXPRESSMAX) 10-5-325 MG/15ML LIQD Take 30 mLs by mouth every 8 (eight) hours as needed (cold).    [provider]  fluticasone (FLONASE) 50 MCG/ACT nasal spray Place 1 spray into both nostrils daily for 14 days. 07/04/19 07/18/19  Avegno, Zachery Dakins, FNP  Guaifenesin 1200 MG TB12 Take 1 tablet (1,200 mg total) by mouth 2 (two) times daily. 09/16/17   Lawyer, Cristal Deer, PA-C  ibuprofen (ADVIL,MOTRIN) 800 MG tablet Take 1 tablet (800 mg total) by mouth every 8 (eight) hours as needed. 09/16/17   Lawyer, Cristal Deer, PA-C  metoCLOPramide (REGLAN) 10 MG tablet Take 1 tablet (10 mg total) by mouth every 6 (six) hours as needed for nausea (nausea/headache). Patient not taking: Reported on 09/16/2017 11/25/16   Arby Barrette, MD  ondansetron (ZOFRAN ODT) 4 MG disintegrating tablet Take 1 tablet (4 mg total) by mouth every 8 (eight) hours as needed for nausea or vomiting. 09/18/18   Street, [place], [place]    Allergies  Patient has no known allergies.  Review of Systems   Review of Systems  All other systems reviewed and are negative.   Physical Exam Updated Vital Signs BP 118/76   Pulse 94   Temp 98.2 F (36.8 C) (Oral)   Resp 16   Ht 5' 7.5" (1.715 m)   Wt 88 kg   LMP 10/09/2019 (Approximate)   SpO2 100%   BMI 29.94 kg/m   Physical Exam Vitals and nursing note reviewed.  Constitutional:      General: She is not in acute distress.    Appearance: She is well-developed.  HENT:     Head: Atraumatic.  Eyes:     Conjunctiva/sclera: Conjunctivae  normal.  Cardiovascular:     Rate and Rhythm: Normal rate and regular rhythm.     Pulses: Normal pulses.     Heart sounds: Normal heart sounds.  Pulmonary:     Effort: Pulmonary effort is normal.     Breath sounds: Normal breath sounds. No wheezing, rhonchi or rales.  Abdominal:     Palpations: Abdomen is soft.     Tenderness: There is no abdominal tenderness.  Musculoskeletal:     Cervical back: Neck supple.  Skin:    Findings: No rash.  Neurological:     Mental Status: She is alert and oriented to person, place, and time.     GCS: GCS eye subscore is 4. GCS verbal subscore is 5. GCS motor subscore is 6.  Psychiatric:        Attention and Perception: Attention normal.        Mood and Affect: Mood normal.        Speech: Speech normal.        Behavior: Behavior is cooperative.        Thought Content: Thought content is not paranoid. Thought content does not include homicidal or suicidal ideation.     ED Results / Procedures / Treatments   Labs (all labs ordered are listed, but only abnormal results are displayed) Labs Reviewed - No data to display  EKG None  Radiology No results found.  Procedures Procedures (including critical care time)  Medications Ordered in ED Medications - No data to display  ED Course  I have reviewed the triage vital signs and the nursing notes.  Pertinent labs & imaging results that were available during my care of the patient were reviewed by me and considered in my medical decision making (see chart for details).    MDM Rules/Calculators/A&P                      BP 118/76   Pulse 94   Temp 98.2 F (36.8 C) (Oral)   Resp 16   Ht 5' 7.5" (1.715 m)   Wt 88 kg   LMP 10/09/2019 (Approximate)   SpO2 100%   BMI 29.94 kg/m   Final Clinical Impression(s) / ED Diagnoses Final diagnoses:  Anxiety    Rx / DC Orders ED Discharge Orders         Ordered    hydrOXYzine (ATARAX/VISTARIL) 25 MG tablet  Every 6 hours PRN     11/18/19  0947         9:45 AM Patient has bouts of anxiety and panic attack and requesting help.  No SI or HI.  She is well-appearing in no acute discomfort and is calm and cooperative.  Will prescribe hydroxyzine as needed for anxiety.  Will provide outpatient resources for psychiatric help.  Return precaution discussed.   Domenic Moras, PA-C 11/18/19 8421    Dorie Rank, MD 11/18/19 1447

## 2019-12-20 ENCOUNTER — Emergency Department (HOSPITAL_COMMUNITY)
Admission: EM | Admit: 2019-12-20 | Discharge: 2019-12-20 | Discharge status: Against Medical Advice | Payer: Medicaid Other | Attending: Emergency Medicine | Admitting: Emergency Medicine

## 2019-12-20 ENCOUNTER — Encounter (HOSPITAL_COMMUNITY): Payer: Self-pay | Admitting: *Deleted

## 2019-12-20 ENCOUNTER — Other Ambulatory Visit: Payer: Self-pay

## 2019-12-20 DIAGNOSIS — Z5321 Procedure and treatment not carried out due to patient leaving prior to being seen by health care provider: Secondary | ICD-10-CM | POA: Insufficient documentation

## 2019-12-20 DIAGNOSIS — R519 Headache, unspecified: Secondary | ICD-10-CM | POA: Diagnosis not present

## 2019-12-20 NOTE — ED Notes (Signed)
Registration said patient removed armband and left.

## 2019-12-20 NOTE — ED Triage Notes (Signed)
Pt states she has had a migraine since yesterday. Frontal head pain associated with n/v. Took tylenol for pain without relief.

## 2019-12-20 NOTE — ED Notes (Addendum)
Patient came out of her room and stated "I need some water and ice or something." Pt was made aware I was waiting for a PA or doctor to sign up for her case before I could give her any type of fluids in case they wanted to draw labs or order any type of scans. Pt then proceeded to say "Arent you a nurse, you should be able to get me some water. If you dont want to do it, then Ill find someone else who will... matter of fact, I need another fucking nurse because I dont like your attitude." Pt proceeded to go back in her room with her guest.

## 2020-08-11 IMAGING — CR DG CHEST 2V
2 series · 2 of 2 positions shown · non-contrast
Comparison: 09/16/2017.

CLINICAL DATA: 28-year-old female with sore throat, productive
cough, headache. Smoker.

EXAM:
CHEST - 2 VIEW

[w chest pa]
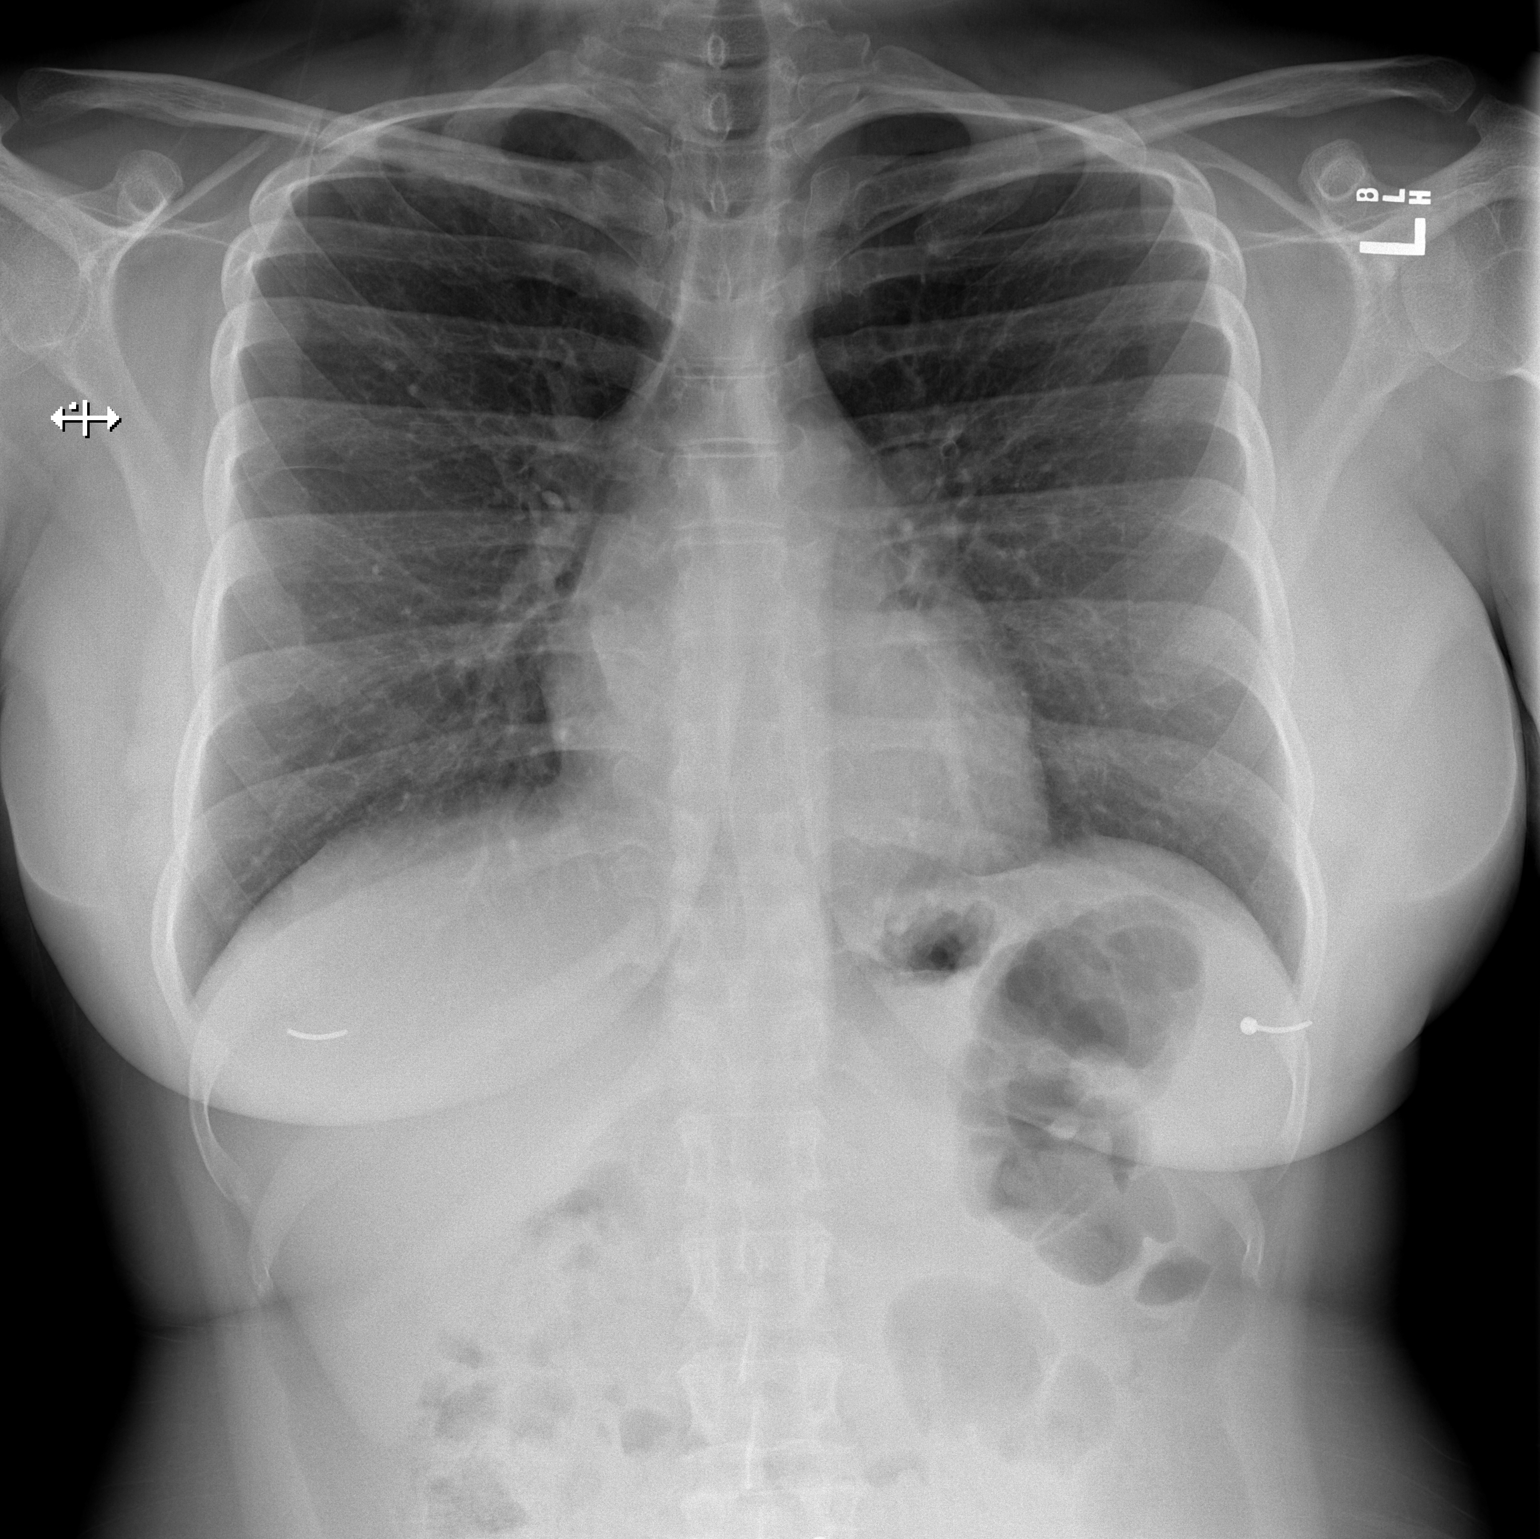

[w chest lat]
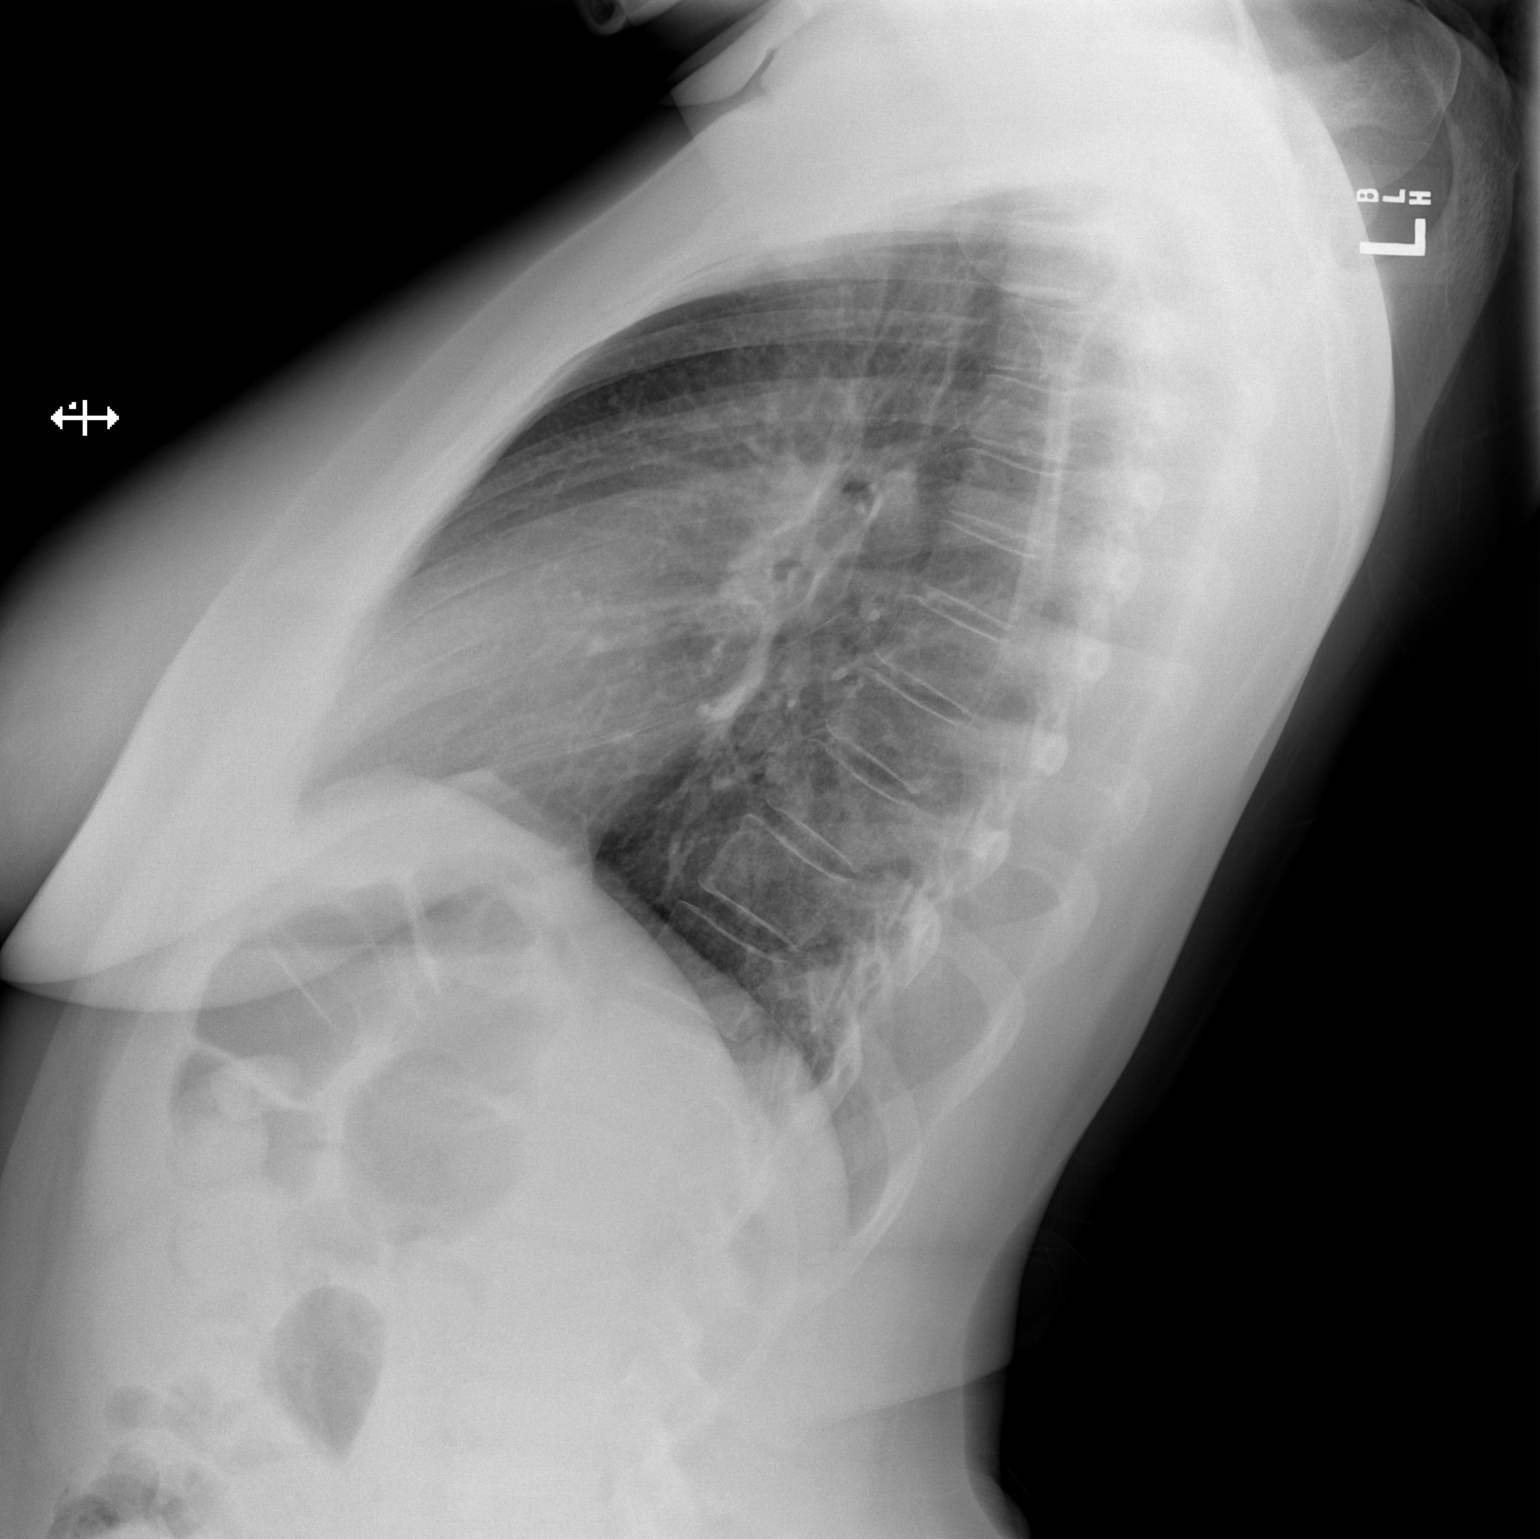

[2 of 2 positions shown; findings below may reference images not displayed]

FINDINGS: Lung volumes and mediastinal contours are stable and within normal
limits. Visualized tracheal air column is within normal limits. No
pneumothorax or pleural effusion. No confluent pulmonary opacity.
Borderline to mild increased interstitial markings appear stable. No
acute osseous abnormality identified. Negative visible bowel gas
pattern. Incidental no pole piercings.
IMPRESSION: Mild increased interstitial markings appear stable and are probably
smoking related. No acute cardiopulmonary abnormality.

## 2020-12-19 ENCOUNTER — Emergency Department (HOSPITAL_COMMUNITY): Payer: Medicaid Other

## 2020-12-19 ENCOUNTER — Other Ambulatory Visit: Payer: Self-pay

## 2020-12-19 ENCOUNTER — Encounter (HOSPITAL_COMMUNITY): Payer: Self-pay

## 2020-12-19 ENCOUNTER — Emergency Department (HOSPITAL_COMMUNITY)
Admission: EM | Admit: 2020-12-19 | Discharge: 2020-12-19 | Disposition: A | Discharge status: Home / Self Care | Payer: Medicaid Other | Attending: Emergency Medicine | Admitting: Emergency Medicine

## 2020-12-19 DIAGNOSIS — R1013 Epigastric pain: Secondary | ICD-10-CM | POA: Insufficient documentation

## 2020-12-19 DIAGNOSIS — R112 Nausea with vomiting, unspecified: Secondary | ICD-10-CM | POA: Diagnosis not present

## 2020-12-19 DIAGNOSIS — M549 Dorsalgia, unspecified: Secondary | ICD-10-CM | POA: Diagnosis not present

## 2020-12-19 DIAGNOSIS — F17210 Nicotine dependence, cigarettes, uncomplicated: Secondary | ICD-10-CM | POA: Diagnosis not present

## 2020-12-19 LAB — CBC WITH DIFFERENTIAL/PLATELET
Abs Immature Granulocytes: 0.01 10*3/uL (ref 0.00–0.07)
Basophils Absolute: 0 10*3/uL (ref 0.0–0.1)
Basophils Relative: 1 %
Eosinophils Absolute: 0.3 10*3/uL (ref 0.0–0.5)
Eosinophils Relative: 4 %
HCT: 39.7 % (ref 36.0–46.0)
Hemoglobin: 13 g/dL (ref 12.0–15.0)
Immature Granulocytes: 0 %
Lymphocytes Relative: 45 %
Lymphs Abs: 2.5 10*3/uL (ref 0.7–4.0)
MCH: 29.5 pg (ref 26.0–34.0)
MCHC: 32.7 g/dL (ref 30.0–36.0)
MCV: 90 fL (ref 80.0–100.0)
Monocytes Absolute: 0.4 10*3/uL (ref 0.1–1.0)
Monocytes Relative: 6 %
Neutro Abs: 2.5 10*3/uL (ref 1.7–7.7)
Neutrophils Relative %: 44 %
Platelets: 303 10*3/uL (ref 150–400)
RBC: 4.41 MIL/uL (ref 3.87–5.11)
RDW: 13.2 % (ref 11.5–15.5)
WBC: 5.7 10*3/uL (ref 4.0–10.5)
nRBC: 0 % (ref 0.0–0.2)

## 2020-12-19 LAB — COMPREHENSIVE METABOLIC PANEL
ALT: 16 U/L (ref 0–44)
AST: 16 U/L (ref 15–41)
Albumin: 3.9 g/dL (ref 3.5–5.0)
Alkaline Phosphatase: 48 U/L (ref 38–126)
Anion gap: 5 (ref 5–15)
BUN: 12 mg/dL (ref 6–20)
CO2: 24 mmol/L (ref 22–32)
Calcium: 9.6 mg/dL (ref 8.9–10.3)
Chloride: 110 mmol/L (ref 98–111)
Creatinine, Ser: 0.82 mg/dL (ref 0.44–1.00)
GFR, Estimated: >60 mL/min (ref >60)
Glucose, Bld: 101 mg/dL — ABNORMAL HIGH (ref 70–99)
Potassium: 4.6 mmol/L (ref 3.5–5.1)
Sodium: 139 mmol/L (ref 135–145)
Total Bilirubin: 0.3 mg/dL (ref 0.3–1.2)
Total Protein: 7 g/dL (ref 6.5–8.1)

## 2020-12-19 LAB — I-STAT BETA HCG BLOOD, ED (MC, WL, AP ONLY): I-stat hCG, quantitative: <5 m[IU]/mL (ref <5)

## 2020-12-19 LAB — URINALYSIS, ROUTINE W REFLEX MICROSCOPIC
Bilirubin Urine: NEGATIVE
Glucose, UA: NEGATIVE mg/dL
Hgb urine dipstick: NEGATIVE
Ketones, ur: NEGATIVE mg/dL
Leukocytes,Ua: NEGATIVE
Nitrite: NEGATIVE
Protein, ur: NEGATIVE mg/dL
Specific Gravity, Urine: 1.004 — ABNORMAL LOW (ref 1.005–1.030)
pH: 7 (ref 5.0–8.0)

## 2020-12-19 LAB — LIPASE, BLOOD: Lipase: 43 U/L (ref 11–51)

## 2020-12-19 MED ORDER — IOHEXOL 300 MG/ML  SOLN
100.0000 mL | Freq: Once | INTRAMUSCULAR | Status: AC | PRN
  Administered 2020-12-19: 100 mL via INTRAVENOUS

## 2020-12-19 MED ORDER — ALUM & MAG HYDROXIDE-SIMETH 200-200-20 MG/5ML PO SUSP
30.0000 mL | Freq: Once | ORAL | Status: AC
  Administered 2020-12-19: 30 mL via ORAL
  Filled 2020-12-19: qty 30

## 2020-12-19 MED ORDER — ONDANSETRON HCL 4 MG/2ML IJ SOLN
4.0000 mg | Freq: Once | INTRAMUSCULAR | Status: AC
  Administered 2020-12-19: 4 mg via INTRAVENOUS
  Filled 2020-12-19: qty 2

## 2020-12-19 MED ORDER — SODIUM CHLORIDE 0.9 % IV BOLUS
1000.0000 mL | Freq: Once | INTRAVENOUS | Status: AC
  Administered 2020-12-19: 1000 mL via INTRAVENOUS

## 2020-12-19 MED ORDER — FAMOTIDINE IN NACL 20-0.9 MG/50ML-% IV SOLN
20.0000 mg | Freq: Once | INTRAVENOUS | Status: AC
  Administered 2020-12-19: 20 mg via INTRAVENOUS
  Filled 2020-12-19: qty 50

## 2020-12-19 MED ORDER — PANTOPRAZOLE SODIUM 20 MG PO TBEC: 20.0000 mg | DELAYED_RELEASE_TABLET | Freq: Every day | ORAL | 0 refills | Status: AC

## 2020-12-19 MED ORDER — SUCRALFATE 1 GM/10ML PO SUSP: 1.0000 g | Freq: Three times a day (TID) | ORAL | 0 refills | Status: AC

## 2020-12-19 MED ORDER — LIDOCAINE VISCOUS HCL 2 % MT SOLN
15.0000 mL | Freq: Once | OROMUCOSAL | Status: AC
  Administered 2020-12-19: 15 mL via ORAL
  Filled 2020-12-19: qty 15

## 2020-12-19 MED ORDER — ONDANSETRON 4 MG PO TBDP: 4.0000 mg | ORAL_TABLET | Freq: Three times a day (TID) | ORAL | 0 refills | Status: AC | PRN

## 2020-12-19 NOTE — ED Notes (Signed)
Patient transported to CT 

## 2020-12-19 NOTE — ED Provider Notes (Signed)
[facility]-EMERGENCY DEPT Provider Note   CSN: [id number] Arrival date & time: 12/19/20  4709     History Chief Complaint  Patient presents with  . Abdominal Pain    Rebekah Bray is a 30 y.o. female with a hx of tobacco abuse, anxiety, & migraines who presents to the ED with complaints of abdominal pain. Patient states she has had problems with almost daily abdominal pain for the past 1 year, seems to have been worse over the past 1 week. Pain is located in the central abdomen, feels like a knot, intermittent without specific triggers or alleviating/aggravating factors. Has had nausea with associated emesis at times- did vomit this morning. Sometimes gets crampy pain in her back with sensation of needing to have a bowel movement as well.  She has been tested for H. pylori for similar symptoms which has been negative.  She has not had prior imaging or endoscopy related to this discomfort over the past 1 year.  Has felt warm at times but no measured fevers at home. Denies diarrhea, hematemesis, melena, hematochezia, dysuria, vaginal bleeding, or vaginal discharge.   HPI     Past Medical History:  Diagnosis Date  . Anxiety   . Migraine headache     There are no problems to display for this patient.   History reviewed. No pertinent surgical history.   OB History   No obstetric history on file.     Family History  Problem Relation Age of Onset  . Hypertension Mother   . Diabetes Mother   . Healthy Neg Hx     Social History   Tobacco Use  . Smoking status: Current Every Day Smoker    Packs/day: 0.35    Types: Cigarettes  . Smokeless tobacco: Never Used  Vaping Use  . Vaping Use: Never used  Substance Use Topics  . Alcohol use: Yes    Comment: socially  . Drug use: No    Home Medications Prior to Admission medications   Medication Sig Start Date End Date Taking? Authorizing Provider  acetaminophen-codeine 120-12 MG/5ML solution Take 10 mLs  by mouth every 4 (four) hours as needed for moderate pain (and cough). 09/16/17   Lawyer, Cristal Deer, PA-C  aspirin-acetaminophen-caffeine (EXCEDRIN MIGRAINE) [phone] MG tablet Take 2-3 tablets by mouth every 6 (six) hours as needed for headache.     [provider]  Aspirin-Salicylamide-Caffeine (BC HEADACHE POWDER PO) Take 1 each by mouth daily as needed (migraine).    [provider]  DM-Phenylephrine-Acetaminophen (THERAFLU EXPRESSMAX) 10-5-325 MG/15ML LIQD Take 30 mLs by mouth every 8 (eight) hours as needed (cold).    [provider]  fluticasone (FLONASE) 50 MCG/ACT nasal spray Place 1 spray into both nostrils daily for 14 days. 07/04/19 07/18/19  Avegno, Zachery Dakins, FNP  Guaifenesin 1200 MG TB12 Take 1 tablet (1,200 mg total) by mouth 2 (two) times daily. 09/16/17   Lawyer, Cristal Deer, PA-C  hydrOXYzine (ATARAX/VISTARIL) 25 MG tablet Take 1 tablet (25 mg total) by mouth every 6 (six) hours as needed for anxiety. 11/18/19   Fayrene Helper, PA-C  ibuprofen (ADVIL,MOTRIN) 800 MG tablet Take 1 tablet (800 mg total) by mouth every 8 (eight) hours as needed. 09/16/17   Lawyer, Cristal Deer, PA-C  metoCLOPramide (REGLAN) 10 MG tablet Take 1 tablet (10 mg total) by mouth every 6 (six) hours as needed for nausea (nausea/headache). Patient not taking: Reported on 09/16/2017 11/25/16   Arby Barrette, MD  ondansetron (ZOFRAN ODT) 4 MG disintegrating tablet Take  1 tablet (4 mg total) by mouth every 8 (eight) hours as needed for nausea or vomiting. 09/18/18   Street, [place], PA-C    Allergies    Patient has no known allergies.  Review of Systems   Review of Systems  Constitutional: Negative for fever.  Respiratory: Negative for shortness of breath.   Cardiovascular: Negative for chest pain.  Gastrointestinal: Positive for abdominal pain, nausea and vomiting. Negative for anal bleeding, blood in stool, constipation and diarrhea.  Genitourinary: Negative for dysuria, vaginal  bleeding and vaginal discharge.  Neurological: Negative for syncope.  All other systems reviewed and are negative.   Physical Exam Updated Vital Signs BP (!) 146/109   Pulse 88   Temp 97.9 F (36.6 C) (Oral)   Resp 16   SpO2 100%   Physical Exam Vitals and nursing note reviewed.  Constitutional:      General: She is not in acute distress.    Appearance: She is well-developed. She is not toxic-appearing.  HENT:     Head: Normocephalic and atraumatic.  Eyes:     General:        Right eye: No discharge.        Left eye: No discharge.     Conjunctiva/sclera: Conjunctivae normal.  Cardiovascular:     Rate and Rhythm: Normal rate and regular rhythm.  Pulmonary:     Effort: Pulmonary effort is normal. No respiratory distress.     Breath sounds: Normal breath sounds. No wheezing, rhonchi or rales.  Abdominal:     General: There is no distension.     Palpations: Abdomen is soft.     Tenderness: There is abdominal tenderness in the epigastric area. There is no guarding or rebound. Negative signs include Murphy's sign and McBurney's sign.     Comments: No CVA tenderness.  Musculoskeletal:     Cervical back: Neck supple.  Skin:    General: Skin is warm and dry.     Findings: No rash.  Neurological:     Mental Status: She is alert.     Comments: Clear speech.   Psychiatric:        Behavior: Behavior normal.     ED Results / Procedures / Treatments   Labs (all labs ordered are listed, but only abnormal results are displayed) Labs Reviewed  COMPREHENSIVE METABOLIC PANEL - Abnormal; Notable for the following components:      Result Value   Glucose, Bld 101 (*)    All other components within normal limits  URINALYSIS, ROUTINE W REFLEX MICROSCOPIC - Abnormal; Notable for the following components:   Color, Urine STRAW (*)    Specific Gravity, Urine 1.004 (*)    All other components within normal limits  CBC WITH DIFFERENTIAL/PLATELET  LIPASE, BLOOD  I-STAT BETA HCG BLOOD,  ED ([facility], [facility], [facility] ONLY)    EKG None  Radiology CT Abdomen Pelvis W Contrast  Result Date: 12/19/2020 CLINICAL DATA:  One year of nonlocalized lower abdominal pain radiating to the back with recent increase in severity EXAM: CT ABDOMEN AND PELVIS WITH CONTRAST TECHNIQUE: Multidetector CT imaging of the abdomen and pelvis was performed using the standard protocol following bolus administration of intravenous contrast. CONTRAST:  OMNIPAQUE IOHEXOL 300 MG/ML  SOLN COMPARISON:  None. FINDINGS: Lower chest: No acute abnormality. Normal size heart. No significant pericardial effusion/thickening. Hepatobiliary: No suspicious hepatic lesion. Gallbladder is unremarkable. No biliary ductal dilation. Pancreas: Within normal limits. Spleen: Within normal limits. Adrenals/Urinary Tract: Adrenal glands are unremarkable. Kidneys are  normal, without renal calculi, solid lesion, or hydronephrosis. Bladder is unremarkable. Stomach/Bowel: Stomach is grossly unremarkable for degree of distension. No pathologic dilation small bowel. The appendix is within normal limits. No suspicious colonic wall thickening or mass like lesions visualized. The colon distal to the splenic flexure is predominantly decompressed limiting evaluation. Vascular/Lymphatic: No significant vascular findings are present. No enlarged abdominal or pelvic lymph nodes. Reproductive: Uterus and bilateral adnexa are unremarkable. Other: No abdominopelvic ascites. Musculoskeletal: No acute or significant osseous findings. IMPRESSION: No acute abdominopelvic findings. Electronically Signed   By: Maudry Mayhew MD   On: 12/19/2020 11:04    Procedures Procedures   Medications Ordered in ED Medications  famotidine (PEPCID) IVPB 20 mg premix (20 mg Intravenous New Bag/Given 12/19/20 0904)  ondansetron (ZOFRAN) injection 4 mg (4 mg Intravenous Given 12/19/20 0904)  sodium chloride 0.9 % bolus 1,000 mL (1,000 mLs Intravenous New Bag/Given 12/19/20 9169)    ED  Course  I have reviewed the triage vital signs and the nursing notes.  Pertinent labs & imaging results that were available during my care of the patient were reviewed by me and considered in my medical decision making (see chart for details).    MDM Rules/Calculators/A&P                         Patient presents to the ED with complaints of abdominal pain. Patient nontoxic appearing, in no apparent distress, vitals without significant abnormality, initial BP mildly elevated. On exam patient tender to the epigastrium, no peritoneal signs. Will evaluate with labs & CT A/P.    Additional history obtained:  Additional history obtained from chart review & nursing note review.   Lab Tests:  I Ordered, reviewed, and interpreted labs, which included:  CBC: Unremarkable.  CMP: unremarkable Lipase: WNL UA: No UTI Preg test: Negative  Imaging Studies ordered:  I ordered imaging studies which included CT A/P, I independently reviewed, formal radiology impression shows: No acute abdominopelvic findings.  ED Course:   12:20:RE-EVAL: Patient feeling much better, resting comfortably, tolerating PO. On repeat abdominal exam patient remains without peritoneal signs, low suspicion for cholecystitis, pancreatitis, diverticulitis, appendicitis, bowel obstruction/perforation, or other acute surgical process. Patient tolerating PO in the emergency department. Will discharge home with supportive measures. I discussed results, treatment plan, need for PCP/GI follow-up, and return precautions with the patient. Provided opportunity for questions, patient confirmed understanding and is in agreement with plan.    Portions of this note were generated with Scientist, clinical (histocompatibility and immunogenetics). Dictation errors may occur despite best attempts at proofreading.   Final Clinical Impression(s) / ED Diagnoses Final diagnoses:  Epigastric pain    Rx / DC Orders ED Discharge Orders         Ordered    pantoprazole (PROTONIX)  20 MG tablet  Daily        12/19/20 1227    sucralfate (CARAFATE) 1 GM/10ML suspension  3 times daily with meals & bedtime        12/19/20 1227    ondansetron (ZOFRAN ODT) 4 MG disintegrating tablet  Every 8 hours PRN        12/19/20 1227           Matraca Hunkins, [place] R, PA-C 12/19/20 1229    Milagros Loll, MD 12/20/20 [phone]

## 2020-12-19 NOTE — ED Notes (Signed)
Patient back from CT at this time

## 2020-12-19 NOTE — Discharge Instructions (Addendum)
Your labs & CT scan were overall reassuring.  We are sending you home with the following medications to help with your symptoms:  - Protonix- please take 1 tablet in the morning prior to any meals to help with stomach acidity/pain.  - Carafate- please take prior to each meal and prior to bedtime to help with stomach acidity/pain.  - Zofran- please take every 8 hours as needed for nausea/vomiting.   We have prescribed you new medication(s) today. Discuss the medications prescribed today with your pharmacist as they can have adverse effects and interactions with your other medicines including over the counter and prescribed medications. Seek medical evaluation if you start to experience new or abnormal symptoms after taking one of these medicines, seek care immediately if you start to experience difficulty breathing, feeling of your throat closing, facial swelling, or rash as these could be indications of a more serious allergic reaction  Please follow attached diet guidelines.   Follow up with your primary care provider within 3 days for re-evaluation.  Return to the ER for new or worsening symptoms including but not limited to worsened pain, new pain, inability to keep fluids down, blood in vomit/stool, passing out, or any other concerns.

## 2020-12-19 NOTE — ED Triage Notes (Signed)
Coming from home, intermittent NVD, lower abdominal pain radiating around to back for 1 year, increased in severity for 1 week, subjective fever

## 2021-05-26 ENCOUNTER — Emergency Department (HOSPITAL_COMMUNITY)
Admission: EM | Admit: 2021-05-26 | Discharge: 2021-05-26 | Discharge status: Against Medical Advice | Payer: Medicaid Other

## 2021-05-26 ENCOUNTER — Other Ambulatory Visit: Payer: Self-pay

## 2022-11-12 IMAGING — CT CT ABD-PELV W/ CM
2 of 4 series · 16 of 46 positions shown, 18 images · IV contrast (OMNIPAQUE 300)
Comparison: None.

CLINICAL DATA: One year of nonlocalized lower abdominal pain
radiating to the back with recent increase in severity

EXAM:
CT ABDOMEN AND PELVIS WITH CONTRAST
TECHNIQUE: Multidetector CT imaging of the abdomen and pelvis was performed
using the standard protocol following bolus administration of
intravenous contrast.
CONTRAST:  100mL OMNIPAQUE IOHEXOL 300 MG/ML  SOLN

[Series 2: axial st · axial · 0.77mm/px · z∈[-456,-66]mm · 13 of 88 slices shown, 15 images]
[im 5/88  soft-tissue]
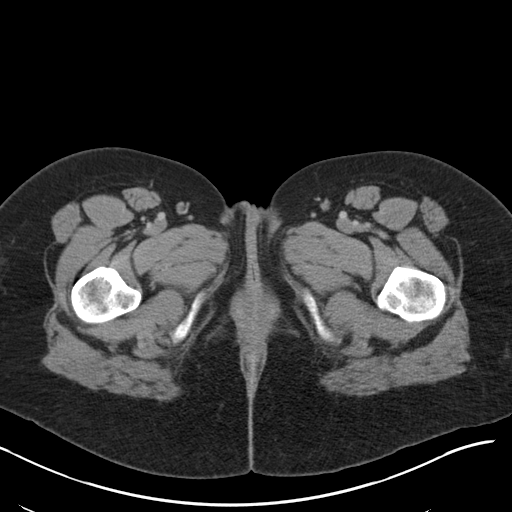
[im 5/88  bone]
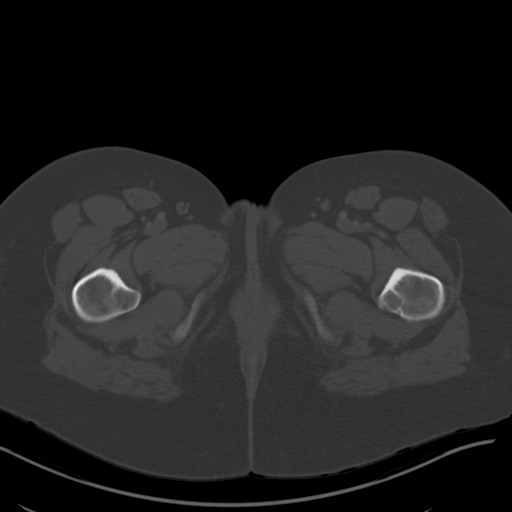
[im 14/88  soft-tissue]
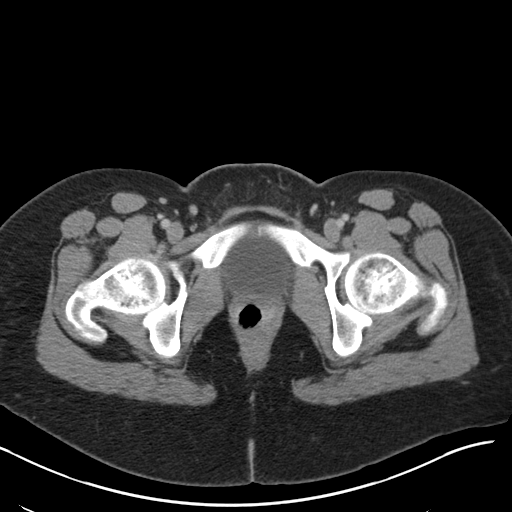
[im 19/88  soft-tissue]
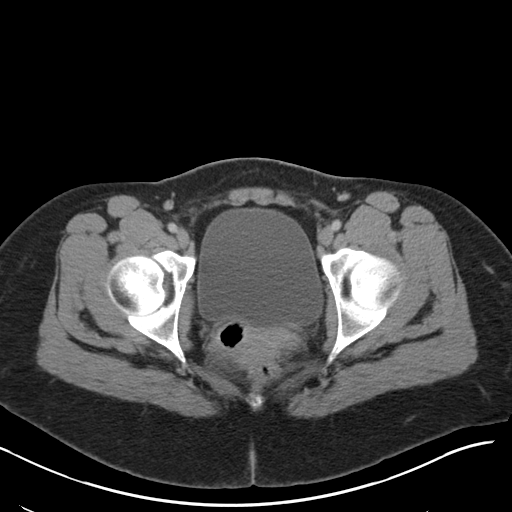
[im 23/88  soft-tissue]
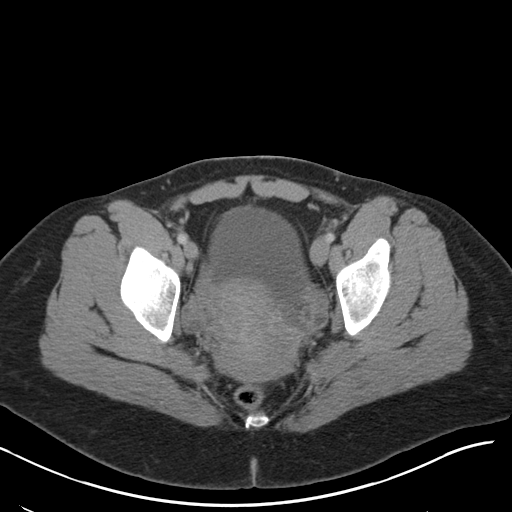
[im 33/88  soft-tissue]
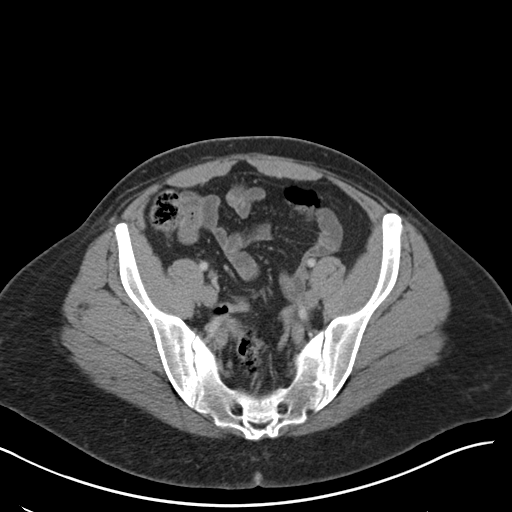
[im 37/88  soft-tissue]
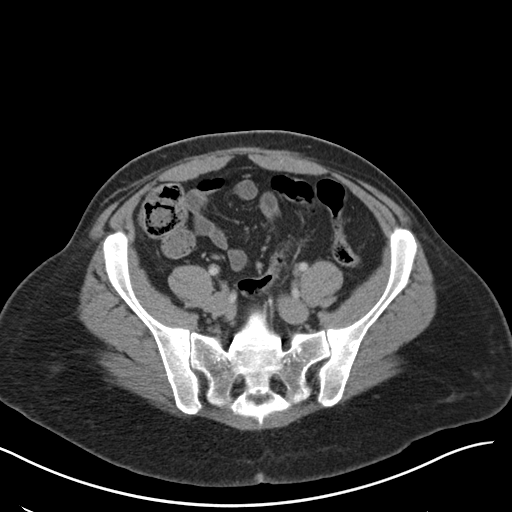
[im 46/88  soft-tissue]
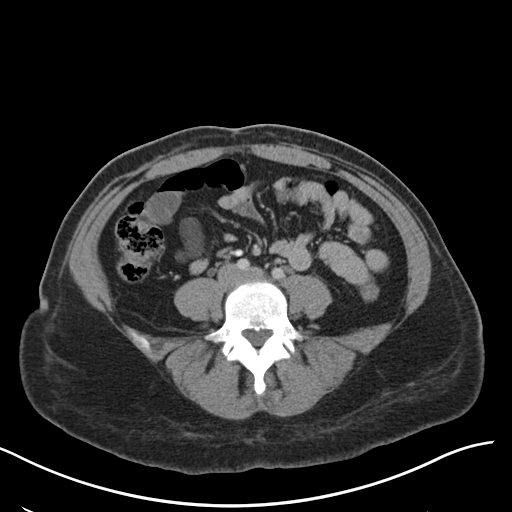
[im 51/88  soft-tissue]
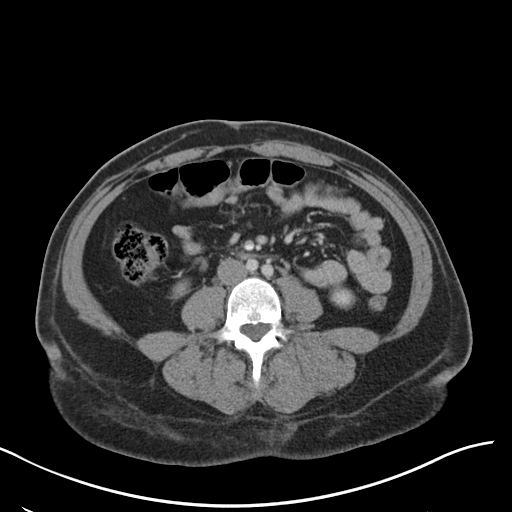
[im 55/88  soft-tissue]
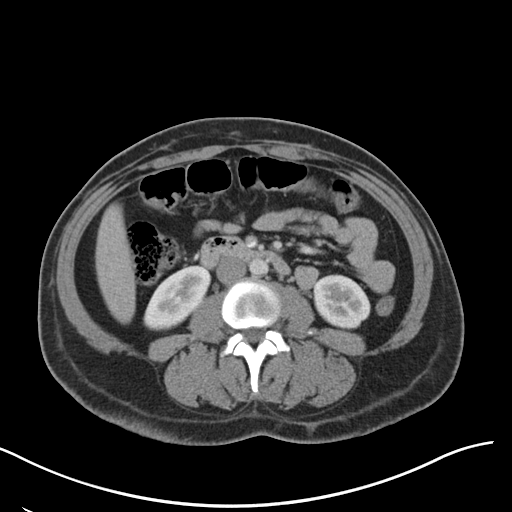
[im 55/88  bone]
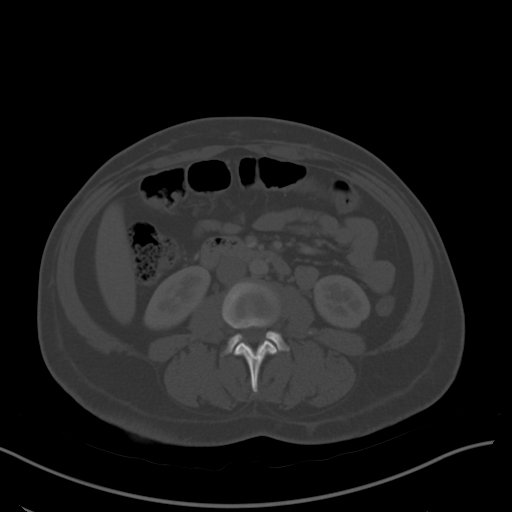
[im 65/88  soft-tissue]
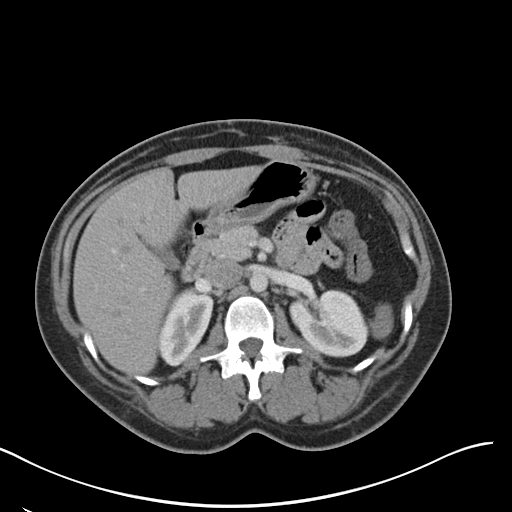
[im 69/88  soft-tissue]
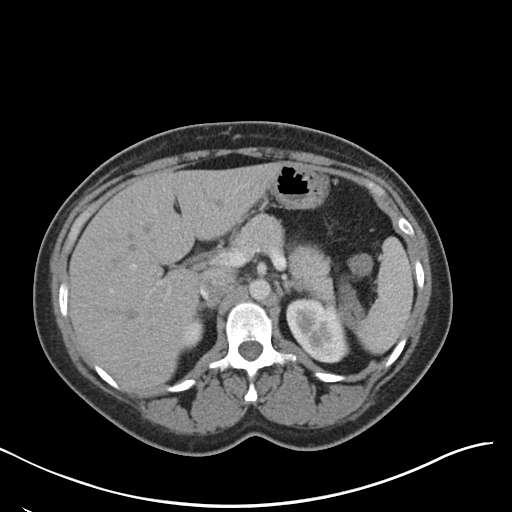
[im 74/88  soft-tissue]
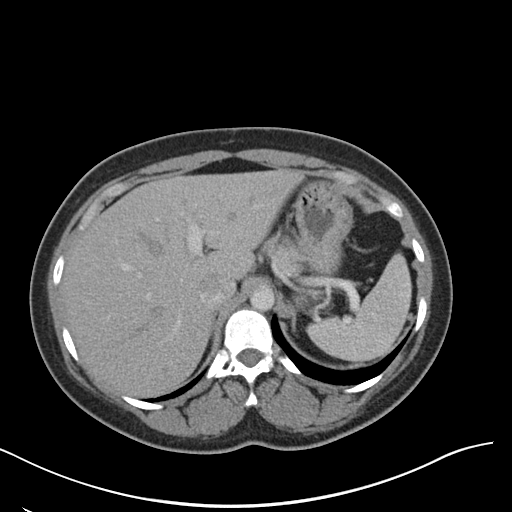
[im 83/88  soft-tissue]
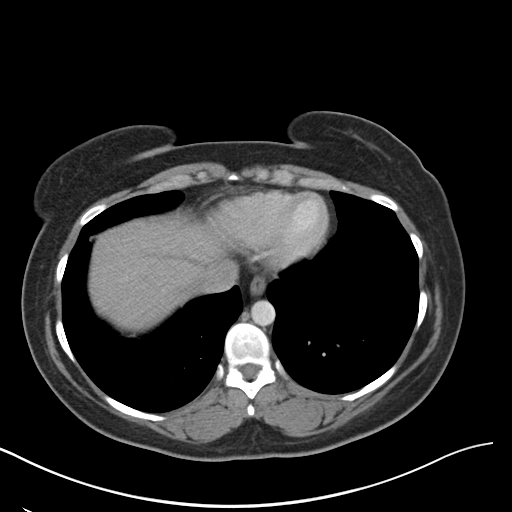

[Series 4: coronal st · coronal · 0.70mm/px · 3 of 151 slices shown]
[im 51/151  soft-tissue]
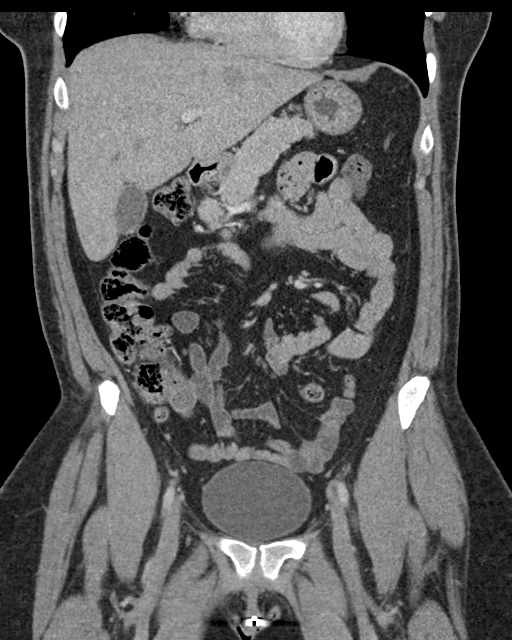
[im 67/151  soft-tissue]
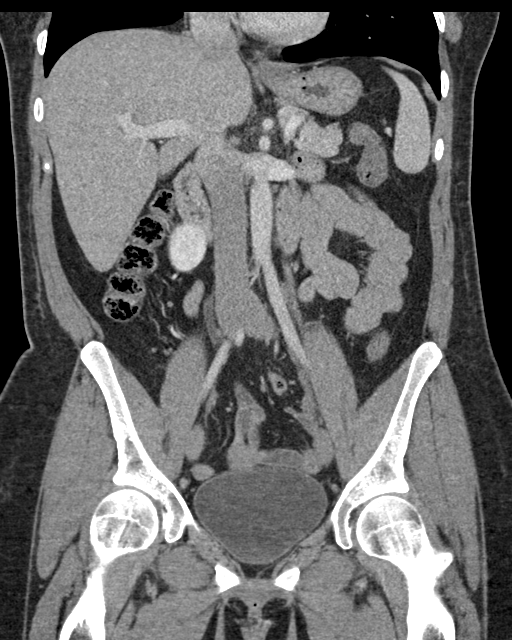
[im 84/151  soft-tissue]
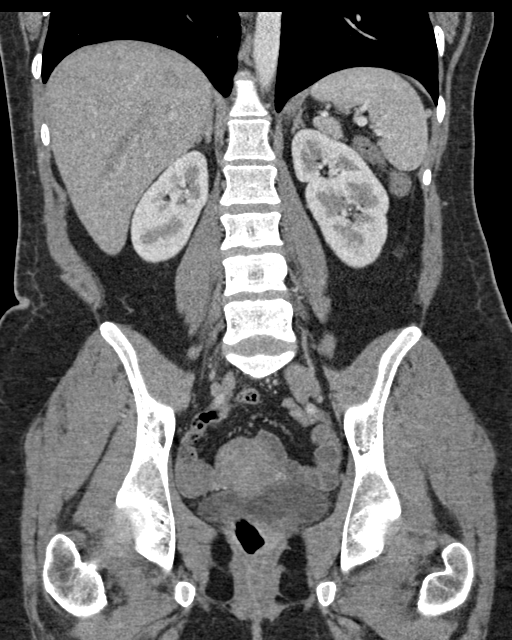

[16 of 46 positions shown; findings below may reference images not displayed]

FINDINGS: Lower chest: No acute abnormality. Normal size heart. No significant
pericardial effusion/thickening.

Hepatobiliary: No suspicious hepatic lesion. Gallbladder is
unremarkable. No biliary ductal dilation.

Pancreas: Within normal limits.

Spleen: Within normal limits.

Adrenals/Urinary Tract: Adrenal glands are unremarkable. Kidneys are
normal, without renal calculi, solid lesion, or hydronephrosis.
Bladder is unremarkable.

Stomach/Bowel: Stomach is grossly unremarkable for degree of
distension. No pathologic dilation small bowel. The appendix is
within normal limits. No suspicious colonic wall thickening or mass
like lesions visualized. The colon distal to the splenic flexure is
predominantly decompressed limiting evaluation.

Vascular/Lymphatic: No significant vascular findings are present. No
enlarged abdominal or pelvic lymph nodes.

Reproductive: Uterus and bilateral adnexa are unremarkable.

Other: No abdominopelvic ascites.

Musculoskeletal: No acute or significant osseous findings.
IMPRESSION: No acute abdominopelvic findings.

## 2023-02-02 ENCOUNTER — Encounter (HOSPITAL_COMMUNITY): Payer: Self-pay

## 2023-02-02 ENCOUNTER — Emergency Department (HOSPITAL_COMMUNITY): Payer: Medicaid Other

## 2023-02-02 ENCOUNTER — Emergency Department (HOSPITAL_COMMUNITY)
Admission: EM | Admit: 2023-02-02 | Discharge: 2023-02-02 | Disposition: A | Discharge status: Home / Self Care | Payer: Medicaid Other | Attending: Emergency Medicine | Admitting: Emergency Medicine

## 2023-02-02 ENCOUNTER — Other Ambulatory Visit: Payer: Self-pay

## 2023-02-02 DIAGNOSIS — X58XXXA Exposure to other specified factors, initial encounter: Secondary | ICD-10-CM | POA: Insufficient documentation

## 2023-02-02 DIAGNOSIS — Z23 Encounter for immunization: Secondary | ICD-10-CM | POA: Insufficient documentation

## 2023-02-02 DIAGNOSIS — S61210A Laceration without foreign body of right index finger without damage to nail, initial encounter: Secondary | ICD-10-CM | POA: Diagnosis present

## 2023-02-02 DIAGNOSIS — Y93G9 Activity, other involving cooking and grilling: Secondary | ICD-10-CM | POA: Insufficient documentation

## 2023-02-02 MED ORDER — TETANUS-DIPHTH-ACELL PERTUSSIS 5-2.5-18.5 LF-MCG/0.5 IM SUSY
0.5000 mL | PREFILLED_SYRINGE | Freq: Once | INTRAMUSCULAR | Status: AC
  Administered 2023-02-02: 0.5 mL via INTRAMUSCULAR
  Filled 2023-02-02: qty 0.5

## 2023-02-02 NOTE — ED Notes (Signed)
Dressing applied. 

## 2023-02-02 NOTE — Discharge Instructions (Signed)
It was a pleasure caring for you today. The laceration on your finger is without bone involvement. We have cleaned and dressed your wound here in ED. I recommend daily dressing changes with non-adherent gauze. You have received tetanus vaccine here in ED today.  Seek emergency care if experiencing any new or worsening symptoms.

## 2023-02-02 NOTE — ED Provider Notes (Signed)
[place] EMERGENCY DEPARTMENT AT [facility] Provider Note   CSN: [id number] Arrival date & time: 02/02/23  1633     History  Chief Complaint  Patient presents with   Laceration    Rebekah Bray is a 32 y.o. female who presents to ED concerned for laceration to tip of right index finger while cutting vegetables at home 1 hour prior to arrival. Patient concerned about the possibility that bone could be involved. Patient denying any other symptoms. Not sure of last tetanus booster.    Laceration      Home Medications Prior to Admission medications   Medication Sig Start Date End Date Taking? Authorizing Provider  acetaminophen-codeine 120-12 MG/5ML solution Take 10 mLs by mouth every 4 (four) hours as needed for moderate pain (and cough). 09/16/17   Lawyer, Cristal Deer, PA-C  aspirin-acetaminophen-caffeine (EXCEDRIN MIGRAINE) [id number] MG tablet Take 2-3 tablets by mouth every 6 (six) hours as needed for headache.     [provider]  Aspirin-Salicylamide-Caffeine (BC HEADACHE POWDER PO) Take 1 each by mouth daily as needed (migraine).    [provider]  DM-Phenylephrine-Acetaminophen (THERAFLU EXPRESSMAX) 10-5-325 MG/15ML LIQD Take 30 mLs by mouth every 8 (eight) hours as needed (cold).    [provider]  fluticasone (FLONASE) 50 MCG/ACT nasal spray Place 1 spray into both nostrils daily for 14 days. 07/04/19 07/18/19  Avegno, Zachery Dakins, FNP  Guaifenesin 1200 MG TB12 Take 1 tablet (1,200 mg total) by mouth 2 (two) times daily. 09/16/17   Lawyer, Cristal Deer, PA-C  hydrOXYzine (ATARAX/VISTARIL) 25 MG tablet Take 1 tablet (25 mg total) by mouth every 6 (six) hours as needed for anxiety. 11/18/19   Fayrene Helper, PA-C  ibuprofen (ADVIL,MOTRIN) 800 MG tablet Take 1 tablet (800 mg total) by mouth every 8 (eight) hours as needed. 09/16/17   Lawyer, Cristal Deer, PA-C  ondansetron (ZOFRAN ODT) 4 MG disintegrating tablet Take 1 tablet (4 mg total) by  mouth every 8 (eight) hours as needed for nausea or vomiting. 12/19/20   Petrucelli, Samantha R, PA-C  pantoprazole (PROTONIX) 20 MG tablet Take 1 tablet (20 mg total) by mouth daily. 12/19/20   Petrucelli, Samantha R, PA-C  sucralfate (CARAFATE) 1 GM/10ML suspension Take 10 mLs (1 g total) by mouth 4 (four) times daily -  with meals and at bedtime. 12/19/20   Petrucelli, Pleas Koch, PA-C  metoCLOPramide (REGLAN) 10 MG tablet Take 1 tablet (10 mg total) by mouth every 6 (six) hours as needed for nausea (nausea/headache). Patient not taking: Reported on 09/16/2017 11/25/16 12/19/20  Arby Barrette, MD      Allergies    Patient has no known allergies.    Review of Systems   Review of Systems  Skin:        Laceration     Physical Exam Updated Vital Signs BP 104/79 (BP Location: Left Arm)   Pulse 60   Temp 98.7 F (37.1 C) (Oral)   Resp 18   Ht 5\' 7"  (1.702 m)   Wt 88 kg   LMP 01/27/2023 (Exact Date)   SpO2 100%   BMI 30.38 kg/m  Physical Exam Vitals and nursing note reviewed.  Constitutional:      General: She is not in acute distress.    Appearance: She is not ill-appearing or toxic-appearing.  HENT:     Head: Normocephalic and atraumatic.  Eyes:     General: No scleral icterus.       Right eye: No discharge.  Left eye: No discharge.     Conjunctiva/sclera: Conjunctivae normal.  Cardiovascular:     Rate and Rhythm: Normal rate.  Pulmonary:     Effort: Pulmonary effort is normal.  Abdominal:     General: Abdomen is flat.  Skin:    General: Skin is warm and dry.     Comments: Scheering laceration to tip on right index finger. No involvement of bone or fingernail. Rest of right hand neurovascularly intact. +2 radial pulse.  Neurological:     General: No focal deficit present.     Mental Status: She is alert. Mental status is at baseline.  Psychiatric:        Mood and Affect: Mood normal.        Behavior: Behavior normal.      ED Results / Procedures / Treatments    Labs (all labs ordered are listed, but only abnormal results are displayed) Labs Reviewed - No data to display  EKG None  Radiology DG Finger Index Right  Result Date: 02/02/2023 CLINICAL DATA:  Injury, cut finger EXAM: RIGHT INDEX FINGER 2+V COMPARISON:  None Available. FINDINGS: There is no evidence of fracture or dislocation. There is no evidence of arthropathy or other focal bone abnormality. There is a small amount of soft tissue amputation of the distal second finger. There is no foreign body. IMPRESSION: Soft tissue amputation of the distal second finger. No fracture or foreign body. Electronically Signed   By: Darliss Cheney M.D.   On: 02/02/2023 19:40    Procedures Procedures    Medications Ordered in ED Medications  Tdap (BOOSTRIX) injection 0.5 mL (0.5 mLs Intramuscular Given 02/02/23 1853)    ED Course/ Medical Decision Making/ A&P                             Medical Decision Making  This patient presents to the ED for concern of a  1 cm simple laceration to their right index finger, this involves an extensive number of treatment options, and is a complaint that carries with it a high risk of complications and morbidity.  The differential diagnosis includes foreign body, fracture, NV compromise. These are considered less likely due to history of present illness and physical exam findings.   Co morbidities that complicate the patient evaluation  none    Imaging Studies ordered:  I ordered imaging studies including  -Right hand xray: to assess the involvement of bone to laceration I independently visualized and interpreted imaging I agree with the radiologist interpretation     Problem List / ED Course / Critical interventions / Medication management  Patient presented for a 1 cm laceration to their right index finger. They are neurovascularly intact. Tetanus is provided in ED today. Patient is in no distress. Xray without concern for bone involvement. Scheer  wound to tip of right index finger is superficial without bone/fingernail involvement. Bleeding controlled with pressure. Due to scheering of skin from tip of index finger, I cannot suture this laceration. Will dress laceration with non-adhesive dressing and provide patient with daily care instructions. Bleeding from laceration controlled with pressure. Laceration cleaned and dressed with non-adhesive gauze. Patient/family educated about specific return precautions for given chief complaint and symptoms.  Patient is stable for discharge at this time. Patient expressed understanding of return precautions and need for follow-up. Patient spoken to regarding all imaging and laboratory results and appropriate follow up for these results. All education provided in verbal  form with additional information in written form. Time was allowed for answering of patient questions. Provided patient with work excuse. I have reviewed the patients home medicines and have made adjustments as needed Patient was given return precautions. Patient stable for discharge at this time.  Patient verbalized understanding of plan.   Social Determinants of Health:  none            Final Clinical Impression(s) / ED Diagnoses Final diagnoses:  Laceration of right index finger without foreign body without damage to nail, initial encounter    Rx / DC Orders ED Discharge Orders     None         Margarita Rana 02/02/23 2031    Derwood Kaplan, MD 02/02/23 2153

## 2023-02-02 NOTE — ED Triage Notes (Signed)
Pt was cutting vegetables when she accidentally cut into her finger. Pt states she sees white, but unsure if it hit bone.

## 2023-12-25 ENCOUNTER — Emergency Department (HOSPITAL_COMMUNITY)
Admission: EM | Admit: 2023-12-25 | Discharge: 2023-12-25 | Disposition: A | Discharge status: Home / Self Care | Attending: Student | Admitting: Student

## 2023-12-25 ENCOUNTER — Encounter (HOSPITAL_COMMUNITY): Payer: Self-pay

## 2023-12-25 ENCOUNTER — Other Ambulatory Visit: Payer: Self-pay

## 2023-12-25 DIAGNOSIS — F17210 Nicotine dependence, cigarettes, uncomplicated: Secondary | ICD-10-CM | POA: Insufficient documentation

## 2023-12-25 DIAGNOSIS — Z7982 Long term (current) use of aspirin: Secondary | ICD-10-CM | POA: Insufficient documentation

## 2023-12-25 DIAGNOSIS — G43809 Other migraine, not intractable, without status migrainosus: Secondary | ICD-10-CM | POA: Insufficient documentation

## 2023-12-25 DIAGNOSIS — R519 Headache, unspecified: Secondary | ICD-10-CM | POA: Diagnosis present

## 2023-12-25 DIAGNOSIS — F419 Anxiety disorder, unspecified: Secondary | ICD-10-CM | POA: Insufficient documentation

## 2023-12-25 MED ORDER — PROCHLORPERAZINE EDISYLATE 10 MG/2ML IJ SOLN
10.0000 mg | Freq: Once | INTRAMUSCULAR | Status: AC
  Administered 2023-12-25: 10 mg via INTRAVENOUS
  Filled 2023-12-25: qty 2

## 2023-12-25 MED ORDER — KETOROLAC TROMETHAMINE 15 MG/ML IJ SOLN
15.0000 mg | Freq: Once | INTRAMUSCULAR | Status: AC
  Administered 2023-12-25: 15 mg via INTRAVENOUS
  Filled 2023-12-25: qty 1

## 2023-12-25 MED ORDER — DIPHENHYDRAMINE HCL 50 MG/ML IJ SOLN
25.0000 mg | Freq: Once | INTRAMUSCULAR | Status: AC
  Administered 2023-12-25: 25 mg via INTRAVENOUS
  Filled 2023-12-25: qty 1

## 2023-12-25 MED ORDER — LACTATED RINGERS IV BOLUS
1000.0000 mL | Freq: Once | INTRAVENOUS | Status: AC
  Administered 2023-12-25: 1000 mL via INTRAVENOUS

## 2023-12-25 MED ORDER — DEXAMETHASONE SODIUM PHOSPHATE 10 MG/ML IJ SOLN
10.0000 mg | Freq: Once | INTRAMUSCULAR | Status: AC
  Administered 2023-12-25: 10 mg via INTRAVENOUS
  Filled 2023-12-25: qty 1

## 2023-12-25 NOTE — ED Triage Notes (Signed)
 Pt c/o migraine w/nausea that began last night. States they had auras preceding the migraine.  Hx migraines

## 2023-12-25 NOTE — ED Provider Notes (Signed)
 [place] EMERGENCY DEPARTMENT AT [facility] Provider Note  CSN: [id number] Arrival date & time: 12/25/23 0807  Chief Complaint(s) Headache and Nausea  HPI Rebekah Bray is a 33 y.o. female with PMH anxiety, migraine headaches who presents emerged part for evaluation of a headache.  States that she had some significant stressors yesterday and this triggered her migraine.  She has associated nausea which is a known oral for this patient.  States that today's headache feels identical to her previous migraines.  Denies associated numbness, tingling, weakness or other neurologic complaints.  Denies chest pain, shortness of breath, vomiting, abdominal pain or other systemic symptoms.   Past Medical History Past Medical History:  Diagnosis Date   Anxiety    Migraine headache    There are no active problems to display for this patient.  Home Medication(s) Prior to Admission medications   Medication Sig Start Date End Date Taking? Authorizing Provider  acetaminophen -codeine  120-12 MG/5ML solution Take 10 mLs by mouth every 4 (four) hours as needed for moderate pain (and cough). 09/16/17   Lawyer, Veryl Gottron, PA-C  aspirin-acetaminophen -caffeine (EXCEDRIN MIGRAINE) 250-250-65 MG tablet Take 2-3 tablets by mouth every 6 (six) hours as needed for headache.     [provider]  Aspirin-Salicylamide-Caffeine (BC HEADACHE POWDER PO) Take 1 each by mouth daily as needed (migraine).    [provider]  DM-Phenylephrine-Acetaminophen  (THERAFLU EXPRESSMAX) 10-5-325 MG/15ML LIQD Take 30 mLs by mouth every 8 (eight) hours as needed (cold).    [provider]  fluticasone  (FLONASE ) 50 MCG/ACT nasal spray Place 1 spray into both nostrils daily for 14 days. 07/04/19 07/18/19  Avegno, Komlanvi S, FNP  Guaifenesin  1200 MG TB12 Take 1 tablet (1,200 mg total) by mouth 2 (two) times daily. 09/16/17   Lawyer, Veryl Gottron, PA-C  hydrOXYzine  (ATARAX /VISTARIL ) 25 MG tablet Take 1  tablet (25 mg total) by mouth every 6 (six) hours as needed for anxiety. 11/18/19   Debbra Fairy, PA-C  ibuprofen  (ADVIL ,MOTRIN ) 800 MG tablet Take 1 tablet (800 mg total) by mouth every 8 (eight) hours as needed. 09/16/17   Lawyer, Veryl Gottron, PA-C  ondansetron  (ZOFRAN  ODT) 4 MG disintegrating tablet Take 1 tablet (4 mg total) by mouth every 8 (eight) hours as needed for nausea or vomiting. 12/19/20   Petrucelli, Samantha R, PA-C  pantoprazole  (PROTONIX ) 20 MG tablet Take 1 tablet (20 mg total) by mouth daily. 12/19/20   Petrucelli, Samantha R, PA-C  sucralfate  (CARAFATE ) 1 GM/10ML suspension Take 10 mLs (1 g total) by mouth 4 (four) times daily -  with meals and at bedtime. 12/19/20   Petrucelli, Samantha R, PA-C  metoCLOPramide  (REGLAN ) 10 MG tablet Take 1 tablet (10 mg total) by mouth every 6 (six) hours as needed for nausea (nausea/headache). Patient not taking: Reported on 09/16/2017 11/25/16 12/19/20  Wynetta Heckle, MD  Past Surgical History No past surgical history on file. Family History Family History  Problem Relation Age of Onset   Hypertension Mother    Diabetes Mother    Healthy Neg Hx     Social History Social History   Tobacco Use   Smoking status: Every Day    Current packs/day: 0.35    Types: Cigarettes   Smokeless tobacco: Never  Vaping Use   Vaping status: Never Used  Substance Use Topics   Alcohol use: Yes    Comment: socially   Drug use: No   Allergies Patient has no known allergies.  Review of Systems Review of Systems  Neurological:  Positive for headaches.    Physical Exam Vital Signs  I have reviewed the triage vital signs BP 139/75 (BP Location: Left Arm)   Pulse 72   Temp 98.1 F (36.7 C) (Oral)   Resp 16   Ht 5\' 7"  (1.702 m)   Wt 83.9 kg   SpO2 100%   BMI 28.98 kg/m   Physical Exam Vitals and nursing note reviewed.   Constitutional:      General: She is not in acute distress.    Appearance: She is well-developed.  HENT:     Head: Normocephalic and atraumatic.  Eyes:     Conjunctiva/sclera: Conjunctivae normal.  Cardiovascular:     Rate and Rhythm: Normal rate and regular rhythm.     Heart sounds: No murmur heard. Pulmonary:     Effort: Pulmonary effort is normal. No respiratory distress.     Breath sounds: Normal breath sounds.  Abdominal:     Palpations: Abdomen is soft.     Tenderness: There is no abdominal tenderness.  Musculoskeletal:        General: No swelling.     Cervical back: Neck supple.  Skin:    General: Skin is warm and dry.     Capillary Refill: Capillary refill takes less than 2 seconds.  Neurological:     Mental Status: She is alert.     Cranial Nerves: No cranial nerve deficit.     Sensory: No sensory deficit.     Motor: No weakness.  Psychiatric:        Mood and Affect: Mood normal.     ED Results and Treatments Labs (all labs ordered are listed, but only abnormal results are displayed) Labs Reviewed - No data to display                                                                                                                        Radiology No results found.  Pertinent labs & imaging results that were available during my care of the patient were reviewed by me and considered in my medical decision making (see MDM for details).  Medications Ordered in ED Medications  prochlorperazine (COMPAZINE) injection 10 mg (has no administration in time range)  diphenhydrAMINE  (BENADRYL ) injection 25 mg (has no administration in time range)  lactated ringers bolus 1,000 mL (  has no administration in time range)                                                                                                                                     Procedures Procedures  (including critical care time)  Medical Decision Making / ED Course   This patient presents to  the ED for concern of headache, this involves an extensive number of treatment options, and is a complaint that carries with it a high risk of complications and morbidity.  The differential diagnosis includes migraine, Cluster, Tension Ha, Dural venous thrombosis, Sinusitis, CO poisoning, HTN, Malignancy  MDM: Patient seen emergency room for evaluation of headache.  Physical exam is unremarkable with no focal motor or sensory deficits.  No cranial nerve deficits.  Given normal neurologic exam and presentation consistent with her known history of migraines, CT imaging deferred.  Low suspicion for acute intracranial pathology.  Patient received a headache cocktail and on reevaluation her symptoms have improved.  At this time she does not meet inpatient criteria for admission and will be discharged with outpatient follow-up.  Return precautions given of which she voiced understanding she was discharged   Additional history obtained:  -External records from outside source obtained and reviewed including: Chart review including previous notes, labs, imaging, consultation notes    Medicines ordered and prescription drug management: Meds ordered this encounter  Medications   prochlorperazine (COMPAZINE) injection 10 mg   diphenhydrAMINE  (BENADRYL ) injection 25 mg   lactated ringers bolus 1,000 mL    -I have reviewed the patients home medicines and have made adjustments as needed  Critical interventions none    Cardiac Monitoring: The patient was maintained on a cardiac monitor.  I personally viewed and interpreted the cardiac monitored which showed an underlying rhythm of: NSR  Social Determinants of Health:  Factors impacting patients care include: none   Reevaluation: After the interventions noted above, I reevaluated the patient and found that they have :improved  Co morbidities that complicate the patient evaluation  Past Medical History:  Diagnosis Date   Anxiety    Migraine  headache       Dispostion: I considered admission for this patient, but at this time she does not meet inpatient criteria for admission and will be discharged outpatient follow-up     Final Clinical Impression(s) / ED Diagnoses Final diagnoses:  None     @PCDICTATION @    Anshika Pethtel, Alyse July, MD 12/25/23 1045

## 2024-08-25 ENCOUNTER — Emergency Department (HOSPITAL_COMMUNITY)
Admission: EM | Admit: 2024-08-25 | Discharge: 2024-08-25 | Disposition: A | Discharge status: Home / Self Care | Attending: Emergency Medicine | Admitting: Emergency Medicine

## 2024-08-25 ENCOUNTER — Other Ambulatory Visit: Payer: Self-pay

## 2024-08-25 DIAGNOSIS — K529 Noninfective gastroenteritis and colitis, unspecified: Secondary | ICD-10-CM

## 2024-08-25 DIAGNOSIS — R112 Nausea with vomiting, unspecified: Secondary | ICD-10-CM | POA: Diagnosis present

## 2024-08-25 DIAGNOSIS — R197 Diarrhea, unspecified: Secondary | ICD-10-CM | POA: Diagnosis not present

## 2024-08-25 LAB — COMPREHENSIVE METABOLIC PANEL WITH GFR
ALT: 19 U/L (ref 0–44)
AST: 17 U/L (ref 15–41)
Albumin: 4.2 g/dL (ref 3.5–5.0)
Alkaline Phosphatase: 57 U/L (ref 38–126)
Anion gap: 8 (ref 5–15)
BUN: 10 mg/dL (ref 6–20)
CO2: 25 mmol/L (ref 22–32)
Calcium: 9.3 mg/dL (ref 8.9–10.3)
Chloride: 106 mmol/L (ref 98–111)
Creatinine, Ser: 1.04 mg/dL — ABNORMAL HIGH (ref 0.44–1.00)
GFR, Estimated: >60 mL/min
Glucose, Bld: 74 mg/dL (ref 70–99)
Potassium: 4.3 mmol/L (ref 3.5–5.1)
Sodium: 139 mmol/L (ref 135–145)
Total Bilirubin: 0.6 mg/dL (ref 0.0–1.2)
Total Protein: 7 g/dL (ref 6.5–8.1)

## 2024-08-25 LAB — URINALYSIS, ROUTINE W REFLEX MICROSCOPIC
Bilirubin Urine: NEGATIVE
Glucose, UA: NEGATIVE mg/dL
Hgb urine dipstick: NEGATIVE
Ketones, ur: NEGATIVE mg/dL
Nitrite: NEGATIVE
Protein, ur: NEGATIVE mg/dL
Specific Gravity, Urine: 1.021 (ref 1.005–1.030)
pH: 5 (ref 5.0–8.0)

## 2024-08-25 LAB — HCG, SERUM, QUALITATIVE: Preg, Serum: NEGATIVE

## 2024-08-25 LAB — CBC
HCT: 41 % (ref 36.0–46.0)
Hemoglobin: 13.9 g/dL (ref 12.0–15.0)
MCH: 30.6 pg (ref 26.0–34.0)
MCHC: 33.9 g/dL (ref 30.0–36.0)
MCV: 90.3 fL (ref 80.0–100.0)
Platelets: 264 K/uL (ref 150–400)
RBC: 4.54 MIL/uL (ref 3.87–5.11)
RDW: 13.2 % (ref 11.5–15.5)
WBC: 7.4 K/uL (ref 4.0–10.5)
nRBC: 0 % (ref 0.0–0.2)

## 2024-08-25 LAB — LIPASE, BLOOD: Lipase: 63 U/L — ABNORMAL HIGH (ref 11–51)

## 2024-08-25 LAB — RESP PANEL BY RT-PCR (RSV, FLU A&B, COVID)  RVPGX2
Influenza A by PCR: NEGATIVE
Influenza B by PCR: NEGATIVE
Resp Syncytial Virus by PCR: NEGATIVE
SARS Coronavirus 2 by RT PCR: NEGATIVE

## 2024-08-25 MED ORDER — LACTATED RINGERS IV SOLN: INTRAVENOUS | Status: DC

## 2024-08-25 MED ORDER — LACTATED RINGERS IV BOLUS
1000.0000 mL | Freq: Once | INTRAVENOUS | Status: AC
  Administered 2024-08-25: 1000 mL via INTRAVENOUS

## 2024-08-25 MED ORDER — ONDANSETRON 8 MG PO TBDP
8.0000 mg | ORAL_TABLET | Freq: Once | ORAL | Status: AC
  Administered 2024-08-25: 8 mg via ORAL
  Filled 2024-08-25: qty 1

## 2024-08-25 NOTE — ED Notes (Signed)
 Introduced myself to pt, established care. Started IV

## 2024-08-25 NOTE — ED Triage Notes (Signed)
 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

## 2024-08-25 NOTE — ED Provider Notes (Signed)
 " [place] EMERG[facility] DE[facility]MENT AT [facility] Provider Note   CSN: [id number] Arrival date & time: 08/25/24  1835     Patient presents with: Emesis and Diarrhea   Rebekah Bray is a 34 y.o. female.   34 year old female presents with nausea vomiting diarrhea.  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  States started with watery diarrhea followed by abdominal cramping.  Has had nonbilious emesis x 3.  Denies any urinary symptoms.  No reported fever.  No neck pain or photophobia.  Unresponsive to home medications       Prior to Admission medications  Medication Sig Start Date End Date Taking? Authorizing Provider  acetaminophen -codeine  120-12 MG/5ML solution Take 10 mLs by mouth every 4 (four) hours as needed for moderate pain (and cough). 09/16/17   Lawyer, Lonni, PA-C  aspirin-acetaminophen -caffeine (EXCEDRIN MIGRAINE) 250-250-65 MG tablet Take 2-3 tablets by mouth every 6 (six) hours as needed for headache.     [provider]  Aspirin-Salicylamide-Caffeine (BC HEADACHE POWDER PO) Take 1 each by mouth daily as needed (migraine).    [provider]  DM-Phenylephrine-Acetaminophen  (THERAFLU EXPRESSMAX) 10-5-325 MG/15ML LIQD Take 30 mLs by mouth every 8 (eight) hours as needed (cold).    [provider]  fluticasone  (FLONASE ) 50 MCG/ACT nasal spray Place 1 spray into both nostrils daily for 14 days. 07/04/19 07/18/19  Avegno, Komlanvi S, FNP  Guaifenesin  1200 MG TB12 Take 1 tablet (1,200 mg total) by mouth 2 (two) times daily. 09/16/17   Lawyer, Lonni, PA-C  hydrOXYzine  (ATARAX /VISTARIL ) 25 MG tablet Take 1 tablet (25 mg total) by mouth every 6 (six) hours as needed for anxiety. 11/18/19   Nivia Colon, PA-C  ibuprofen  (ADVIL ,MOTRIN ) 800 MG tablet Take 1 tablet (800 mg total) by mouth every 8 (eight) hours as needed. 09/16/17   Lawyer, Lonni, PA-C  ondansetron  (ZOFRAN  ODT) 4 MG disintegrating tablet Take 1  tablet (4 mg total) by mouth every 8 (eight) hours as needed for nausea or vomiting. 12/19/20   Petrucelli, Samantha R, PA-C  pantoprazole  (PROTONIX ) 20 MG tablet Take 1 tablet (20 mg total) by mouth daily. 12/19/20   Petrucelli, Samantha R, PA-C  sucralfate  (CARAFATE ) 1 GM/10ML suspension Take 10 mLs (1 g total) by mouth 4 (four) times daily -  with meals and at bedtime. 12/19/20   Petrucelli, Samantha R, PA-C  metoCLOPramide  (REGLAN ) 10 MG tablet Take 1 tablet (10 mg total) by mouth every 6 (six) hours as needed for nausea (nausea/headache). Patient not taking: Reported on 09/16/2017 11/25/16 12/19/20  Armenta Canning, MD    Allergies: Metronidazole    Review of Systems  All other systems reviewed and are negative.   Updated Vital Signs BP (!) 120/95 (BP Location: Right Arm)   Pulse 74   Temp 98.5 F (36.9 C) (Oral)   Resp 16   SpO2 100%   Physical Exam Vitals and nursing note reviewed.  Constitutional:      General: She is not in acute distress.    Appearance: Normal appearance. She is well-developed. She is not toxic-appearing.  HENT:     Head: Normocephalic and atraumatic.  Eyes:     General: Lids are normal.     Conjunctiva/sclera: Conjunctivae normal.     Pupils: Pupils are equal, round, and reactive to light.  Neck:     Thyroid: No thyroid mass.     Trachea: No tracheal deviation.  Cardiovascular:     Rate and Rhythm: Normal rate and  regular rhythm.     Heart sounds: Normal heart sounds. No murmur heard.    No gallop.  Pulmonary:     Effort: Pulmonary effort is normal. No respiratory distress.     Breath sounds: Normal breath sounds. No stridor. No decreased breath sounds, wheezing, rhonchi or rales.  Abdominal:     General: There is no distension.     Palpations: Abdomen is soft.     Tenderness: There is no abdominal tenderness. There is no rebound.  Musculoskeletal:        General: No tenderness. Normal range of motion.     Cervical back: Normal range of motion and  neck supple.  Skin:    General: Skin is warm and dry.     Findings: No abrasion or rash.  Neurological:     Mental Status: She is alert and oriented to person, place, and time. Mental status is at baseline.     GCS: GCS eye subscore is 4. GCS verbal subscore is 5. GCS motor subscore is 6.     Cranial Nerves: No cranial nerve deficit.     Sensory: No sensory deficit.     Motor: Motor function is intact.  Psychiatric:        Attention and Perception: Attention normal.        Speech: Speech normal.        Behavior: Behavior normal.     (all labs ordered are listed, but only abnormal results are displayed) Labs Reviewed  LIPASE, BLOOD - Abnormal; Notable for the following components:      Result Value   Lipase 63 (*)    All other components within normal limits  COMPREHENSIVE METABOLIC PANEL WITH GFR - Abnormal; Notable for the following components:   Creatinine, Ser 1.04 (*)    All other components within normal limits  CBC  URINALYSIS, ROUTINE W REFLEX MICROSCOPIC  HCG, SERUM, QUALITATIVE    EKG: None  Radiology: No results found.   Procedures   Medications Ordered in the ED  lactated ringers  bolus 1,000 mL (has no administration in time range)  lactated ringers  infusion (has no administration in time range)  ondansetron  (ZOFRAN -ODT) disintegrating tablet 8 mg (has no administration in time range)                                    Medical Decision Making Amount and/or Complexity of Data Reviewed Labs: ordered.  Risk Prescription drug management.   Patient given IV fluids and COVID and flu test negative here.  Labs are reassuring.  Patient feels well and will be discharged     Final diagnoses:  None    ED Discharge Orders     None          Dasie Faden, MD 08/25/24 2211  "
